# Patient Record
Sex: Male | Born: 1950
Health system: Southern US, Community
[De-identification: ages and names within clinical notes are randomized; demographics above are authoritative.]

## PROBLEM LIST (undated history)

## (undated) DIAGNOSIS — N400 Enlarged prostate without lower urinary tract symptoms: Secondary | ICD-10-CM

## (undated) DIAGNOSIS — IMO0002 Reserved for concepts with insufficient information to code with codable children: Secondary | ICD-10-CM

## (undated) DIAGNOSIS — L408 Other psoriasis: Secondary | ICD-10-CM

## (undated) HISTORY — DX: Benign prostatic hyperplasia without lower urinary tract symptoms: N40.0

## (undated) HISTORY — DX: Reserved for concepts with insufficient information to code with codable children: IMO0002

## (undated) HISTORY — DX: Other psoriasis: L40.8

## (undated) HISTORY — PX: BACK SURGERY: SHX140

---

## 2004-03-27 ENCOUNTER — Ambulatory Visit: Payer: Self-pay | Admitting: Family Medicine

## 2004-04-03 ENCOUNTER — Ambulatory Visit: Payer: Self-pay | Admitting: Family Medicine

## 2004-04-03 ENCOUNTER — Encounter: Admission: RE | Admit: 2004-04-03 | Discharge: 2004-04-03 | Payer: Self-pay | Admitting: Family Medicine

## 2004-04-19 ENCOUNTER — Ambulatory Visit: Payer: Self-pay | Admitting: Gastroenterology

## 2004-05-03 ENCOUNTER — Ambulatory Visit: Payer: Self-pay | Admitting: Gastroenterology

## 2004-05-24 ENCOUNTER — Ambulatory Visit: Payer: Self-pay | Admitting: Family Medicine

## 2004-05-30 ENCOUNTER — Ambulatory Visit: Payer: Self-pay | Admitting: Family Medicine

## 2004-06-07 ENCOUNTER — Encounter (HOSPITAL_COMMUNITY): Admission: RE | Admit: 2004-06-07 | Discharge: 2004-06-07 | Payer: Self-pay | Admitting: Family Medicine

## 2004-07-07 ENCOUNTER — Ambulatory Visit: Payer: Self-pay | Admitting: Family Medicine

## 2005-03-28 ENCOUNTER — Ambulatory Visit: Payer: Self-pay | Admitting: Family Medicine

## 2005-04-16 ENCOUNTER — Ambulatory Visit: Payer: Self-pay | Admitting: Family Medicine

## 2005-05-11 ENCOUNTER — Encounter: Admission: RE | Admit: 2005-05-11 | Discharge: 2005-05-11 | Payer: Self-pay | Admitting: Family Medicine

## 2005-05-22 ENCOUNTER — Ambulatory Visit: Payer: Self-pay | Admitting: Cardiology

## 2005-05-24 ENCOUNTER — Ambulatory Visit: Payer: Self-pay | Admitting: Family Medicine

## 2005-06-12 ENCOUNTER — Ambulatory Visit: Payer: Self-pay | Admitting: Pulmonary Disease

## 2006-04-19 ENCOUNTER — Ambulatory Visit: Payer: Self-pay | Admitting: Family Medicine

## 2006-04-19 LAB — CONVERTED CEMR LAB
AST: 37 units/L (ref 0–37)
Albumin: 4.2 g/dL (ref 3.5–5.2)
Alkaline Phosphatase: 75 units/L (ref 39–117)
BUN: 15 mg/dL (ref 6–23)
Basophils Absolute: 0 10*3/uL (ref 0.0–0.1)
Calcium: 9.7 mg/dL (ref 8.4–10.5)
Chloride: 104 meq/L (ref 96–112)
Cholesterol: 126 mg/dL (ref 0–200)
Creatinine, Ser: 1 mg/dL (ref 0.4–1.5)
Eosinophils Absolute: 0.4 10*3/uL (ref 0.0–0.6)
GFR calc non Af Amer: 82 mL/min
LDL Cholesterol: 74 mg/dL (ref 0–99)
MCHC: 35.5 g/dL (ref 30.0–36.0)
MCV: 95.6 fL (ref 78.0–100.0)
Monocytes Relative: 9.8 % (ref 3.0–11.0)
PSA: 1.08 ng/mL (ref 0.10–4.00)
Platelets: 320 10*3/uL (ref 150–400)
RBC: 4.25 M/uL (ref 4.22–5.81)
RDW: 12.2 % (ref 11.5–14.6)
Sodium: 144 meq/L (ref 135–145)
Total CHOL/HDL Ratio: 2.8
Triglycerides: 37 mg/dL (ref 0–149)

## 2006-09-18 DIAGNOSIS — N400 Enlarged prostate without lower urinary tract symptoms: Secondary | ICD-10-CM | POA: Insufficient documentation

## 2006-09-18 HISTORY — DX: Benign prostatic hyperplasia without lower urinary tract symptoms: N40.0

## 2007-07-04 ENCOUNTER — Ambulatory Visit: Payer: Self-pay | Admitting: Family Medicine

## 2007-07-04 LAB — CONVERTED CEMR LAB
Alkaline Phosphatase: 48 units/L (ref 39–117)
Bilirubin, Direct: 0.1 mg/dL (ref 0.0–0.3)
Eosinophils Absolute: 0.3 10*3/uL (ref 0.0–0.7)
GFR calc Af Amer: 99 mL/min
GFR calc non Af Amer: 82 mL/min
HCT: 41.1 % (ref 39.0–52.0)
HDL: 45.3 mg/dL (ref >39.0)
MCV: 97.7 fL (ref 78.0–100.0)
Monocytes Absolute: 0.4 10*3/uL (ref 0.1–1.0)
Monocytes Relative: 7.9 % (ref 3.0–12.0)
Neutrophils Relative %: 60.3 % (ref 43.0–77.0)
Platelets: 335 10*3/uL (ref 150–400)
Potassium: 4.4 meq/L (ref 3.5–5.1)
RDW: 12.7 % (ref 11.5–14.6)
Sodium: 139 meq/L (ref 135–145)
Total CHOL/HDL Ratio: 2.8
Triglycerides: 32 mg/dL (ref 0–149)
VLDL: 6 mg/dL (ref 0–40)

## 2007-07-14 ENCOUNTER — Ambulatory Visit: Payer: Self-pay | Admitting: Family Medicine

## 2007-07-14 DIAGNOSIS — L409 Psoriasis, unspecified: Secondary | ICD-10-CM

## 2007-07-14 DIAGNOSIS — L408 Other psoriasis: Secondary | ICD-10-CM

## 2007-07-14 HISTORY — DX: Other psoriasis: L40.8

## 2010-01-30 ENCOUNTER — Other Ambulatory Visit: Payer: Self-pay | Admitting: Family Medicine

## 2010-01-30 ENCOUNTER — Ambulatory Visit
Admission: RE | Admit: 2010-01-30 | Discharge: 2010-01-30 | Payer: Self-pay | Source: Home / Self Care | Attending: Family Medicine | Admitting: Family Medicine

## 2010-01-30 LAB — CONVERTED CEMR LAB
Bilirubin Urine: NEGATIVE
Blood in Urine, dipstick: NEGATIVE
Glucose, Urine, Semiquant: NEGATIVE
Ketones, urine, test strip: NEGATIVE
Protein, U semiquant: NEGATIVE
pH: 6

## 2010-01-30 LAB — CBC WITH DIFFERENTIAL/PLATELET
Basophils Relative: 0.3 % (ref 0.0–3.0)
Eosinophils Absolute: 0.7 10*3/uL (ref 0.0–0.7)
Eosinophils Relative: 9.3 % — ABNORMAL HIGH (ref 0.0–5.0)
Lymphocytes Relative: 21 % (ref 12.0–46.0)
MCHC: 33.8 g/dL (ref 30.0–36.0)
MCV: 98.1 fl (ref 78.0–100.0)
Monocytes Absolute: 0.6 10*3/uL (ref 0.1–1.0)
Neutrophils Relative %: 61.6 % (ref 43.0–77.0)
Platelets: 313 10*3/uL (ref 150.0–400.0)
RBC: 4.51 Mil/uL (ref 4.22–5.81)
WBC: 7.4 10*3/uL (ref 4.5–10.5)

## 2010-01-30 LAB — HEPATIC FUNCTION PANEL
Alkaline Phosphatase: 85 U/L (ref 39–117)
Bilirubin, Direct: 0.1 mg/dL (ref 0.0–0.3)
Total Bilirubin: 0.7 mg/dL (ref 0.3–1.2)
Total Protein: 6.9 g/dL (ref 6.0–8.3)

## 2010-01-30 LAB — LIPID PANEL
Cholesterol: 141 mg/dL (ref 0–200)
HDL: 52.9 mg/dL (ref >39.00)
LDL Cholesterol: 80 mg/dL (ref 0–99)
Total CHOL/HDL Ratio: 3
Triglycerides: 41 mg/dL (ref 0.0–149.0)

## 2010-01-30 LAB — BASIC METABOLIC PANEL
BUN: 16 mg/dL (ref 6–23)
Calcium: 9 mg/dL (ref 8.4–10.5)
Chloride: 101 mEq/L (ref 96–112)
Creatinine, Ser: 0.9 mg/dL (ref 0.4–1.5)

## 2010-01-30 LAB — PSA: PSA: 0.88 ng/mL (ref 0.10–4.00)

## 2010-02-06 ENCOUNTER — Encounter: Payer: Self-pay | Admitting: Family Medicine

## 2010-02-06 ENCOUNTER — Ambulatory Visit
Admission: RE | Admit: 2010-02-06 | Discharge: 2010-02-06 | Payer: Self-pay | Source: Home / Self Care | Attending: Family Medicine | Admitting: Family Medicine

## 2010-02-06 DIAGNOSIS — M799 Soft tissue disorder, unspecified: Secondary | ICD-10-CM | POA: Insufficient documentation

## 2010-02-06 DIAGNOSIS — F528 Other sexual dysfunction not due to a substance or known physiological condition: Secondary | ICD-10-CM | POA: Insufficient documentation

## 2010-02-07 ENCOUNTER — Ambulatory Visit
Admission: RE | Admit: 2010-02-07 | Discharge: 2010-02-07 | Payer: Self-pay | Source: Home / Self Care | Attending: Family Medicine | Admitting: Family Medicine

## 2010-02-08 ENCOUNTER — Encounter: Payer: Self-pay | Admitting: Family Medicine

## 2010-02-08 ENCOUNTER — Other Ambulatory Visit: Payer: Self-pay | Admitting: Family Medicine

## 2010-02-08 DIAGNOSIS — IMO0002 Reserved for concepts with insufficient information to code with codable children: Secondary | ICD-10-CM

## 2010-02-14 ENCOUNTER — Ambulatory Visit: Payer: Self-pay | Admitting: Physical Therapy

## 2010-02-15 NOTE — Assessment & Plan Note (Signed)
Summary: cpx/njr   Vital Signs:  Patient profile:   60 year old male Height:      70.25 inches Weight:      183 pounds BMI:     26.17 Temp:     97.7 degrees F oral BP sitting:   110 / 80  (left arm) Cuff size:   regular  Vitals Entered By: Kern Reap CMA Guerrini Dull) (February 06, 2010 3:57 PM) CC: wellness exam   CC:  wellness exam.  History of Present Illness: Todd Carpenter is a 60 year old, married male, nonsmokerwho comes in today for general medical exam, because of underlying psoriasis, and pain and numbness in his left arm x 3 months and a request for Viagra  His psoriasis is acting up sneezer really inflamed.  It.  He's been using the Dovonex and Lidex.  We discussed taking a short course of prednisone.  Also he's had pain in the left side of his neck with some radiation down to his left arm for about 3 months.  No history of trauma.  He also has some numbness and tingling that comes and goes.  He's experiencing some mild ED, and would like some Viagra.  Tetanus 2009  Allergies: No Known Drug Allergies  Past History:  Past medical, surgical, family and social histories (including risk factors) reviewed, and no changes noted (except as noted below).  Past Medical History: Reviewed history from 09/18/2006 and no changes required. Psoriasis Benign prostatic hypertrophy  Past Surgical History: Reviewed history from 09/18/2006 and no changes required. Disc Back Surgery  Family History: Reviewed history from 09/18/2006 and no changes required. Family History of Cervical cancer Family History of Prostate CA 1st degree relative <50 Family History of Ulcers Family History of Cardiovascular disorder  Social History: Reviewed history from 09/18/2006 and no changes required. Married Former Smoker Alcohol use-no Drug use-yes  Review of Systems      See HPI  Physical Exam  General:  Well-developed,well-nourished,in no acute distress; alert,appropriate and cooperative  throughout examination Head:  Normocephalic and atraumatic without obvious abnormalities. No apparent alopecia or balding. Eyes:  No corneal or conjunctival inflammation noted. EOMI. Perrla. Funduscopic exam benign, without hemorrhages, exudates or papilledema. Vision grossly normal. Ears:  External ear exam shows no significant lesions or deformities.  Otoscopic examination reveals clear canals, tympanic membranes are intact bilaterally without bulging, retraction, inflammation or discharge. Hearing is grossly normal bilaterally. Nose:  External nasal examination shows no deformity or inflammation. Nasal mucosa are pink and moist without lesions or exudates. Mouth:  Oral mucosa and oropharynx without lesions or exudates.  Teeth in good repair. Neck:  No deformities, masses, or tenderness noted. Chest Wall:  No deformities, masses, tenderness or gynecomastia noted. Breasts:  No masses or gynecomastia noted Lungs:  Normal respiratory effort, chest expands symmetrically. Lungs are clear to auscultation, no crackles or wheezes. Heart:  Normal rate and regular rhythm. S1 and S2 normal without gallop, murmur, click, rub or other extra sounds. Abdomen:  Bowel sounds positive,abdomen soft and non-tender without masses, organomegaly or hernias noted. Rectal:  No external abnormalities noted. Normal sphincter tone. No rectal masses or tenderness. Genitalia:  Testes bilaterally descended without nodularity, tenderness or masses. No scrotal masses or lesions. No penis lesions or urethral discharge. Prostate:  Prostate gland firm and smooth, no enlargement, nodularity, tenderness, mass, asymmetry or induration. Msk:  No deformity or scoliosis noted of thoracic or lumbar spine.   Pulses:  R and L carotid,radial,femoral,dorsalis pedis and posterior tibial pulses are full and  equal bilaterally Extremities:  No clubbing, cyanosis, edema, or deformity noted with normal full range of motion of all joints.     Neurologic:  No cranial nerve deficits noted. Station and gait are normal. Plantar reflexes are down-going bilaterally. DTRs are symmetrical throughout. Sensory, motor and coordinative functions appear intact. Skin:  total body skin exam except for severe psoriasis of both knees Cervical Nodes:  No lymphadenopathy noted Axillary Nodes:  No palpable lymphadenopathy Inguinal Nodes:  No significant adenopathy Psych:  Cognition and judgment appear intact. Alert and cooperative with normal attention span and concentration. No apparent delusions, illusions, hallucinations   Problems:  Medical Problems Added: 1)  Dx of Cervical Disorder, Nos  (ICD-723.9) 2)  Dx of Erectile Dysfunction  (ICD-302.72)  Impression & Recommendations:  Problem # 1:  CERVICAL DISORDER, NOS (ICD-723.9) Assessment New  Orders: Physical Therapy Referral (PT) T-Cervical Spine Comp w/Flex & Ext (16109UE) Prescription Created Electronically [phone])  Problem # 2:  ERECTILE DYSFUNCTION (ICD-302.72) Assessment: New  His updated medication list for this problem includes:    Viagra 50 Mg Tabs (Sildenafil citrate) ..... Uad  Orders: Prescription Created Electronically [phone])  Problem # 3:  ROUTINE GENERAL MEDICAL EXAM@HEALTH  CARE FACL (ICD-V70.0) Assessment: Unchanged  Orders: Prescription Created Electronically [phone]) EKG w/ Interpretation (93000)  Problem # 4:  PSORIASIS (ICD-696.1) Assessment: Deteriorated  Orders: Prescription Created Electronically [phone])  Complete Medication List: 1)  Dovonex 0.005 % Crea (Calcipotriene) .... Apply two times a day 2)  Lidex 0.05 % Crea (Fluocinonide) .... Apply two times a day 3)  Adult Aspirin Low Strength 81 Mg Tbdp (Aspirin) 4)  Prednisone 20 Mg Tabs (Prednisone) .... Uad 5)  Viagra 50 Mg Tabs (Sildenafil citrate) .... Uad  Patient Instructions: 1)  begin prednisone two tabs x 3 days, one x 3 days, a half x 3 days, then half a tablet Monday, Wednesday,  Friday, for a 3-week taper. 2)  This should help not only her psoriasis, but the cervical disk problem. 3)  I will put in a request for physical therapy. 4)  Return in 3 weeks for follow-up Prescriptions: VIAGRA 50 MG TABS (SILDENAFIL CITRATE) UAD  #6 x 11   Entered and Authorized by:   Roderick Pee MD   Signed by:   Roderick Pee MD on 02/06/2010   Method used:   Electronically to        [facility] [address] (retail)       [phone]       [place], [place]  [address]       Ph: [phone]       Fax: [phone]   RxID:   [id number] LIDEX 0.05 %  CREA (FLUOCINONIDE) apply two times a day  #60gm x 6   Entered and Authorized by:   Roderick Pee MD   Signed by:   Roderick Pee MD on 02/06/2010   Method used:   Electronically to        [facility] [phone]* (retail)       [address]       [place], [place]  [address]       Ph: [phone]       Fax: [phone]   RxID:   [id number] DOVONEX 0.005 %  CREA (CALCIPOTRIENE) apply two times a day  #60 gr x 6   Entered and Authorized by:  Roderick Pee MD   Signed by:   Roderick Pee MD on 02/06/2010   Method used:   Electronically to        [facility] [phone]* (retail)       [address]       [place], [place]  [address]       Ph: [phone]       Fax: [phone]   RxID:   [id number] PREDNISONE 20 MG TABS (PREDNISONE) UAD  #50 x 1   Entered and Authorized by:   Roderick Pee MD   Signed by:   Roderick Pee MD on 02/06/2010   Method used:   Electronically to        [facility] [phone]* (retail)       [address]       [place], [place]  [address]       Ph: [phone]       Fax: [phone]   RxID:   [id number]    Orders Added: 1)  Physical Therapy Referral [PT] 2)  T-Cervical Spine Comp w/Flex & Ext [72052TC] 3)  Prescription Created Electronically  [G8553] 4)  Est. Patient 40-64 years [99396] 5)  EKG w/ Interpretation [93000]

## 2010-02-16 ENCOUNTER — Other Ambulatory Visit: Payer: Self-pay | Admitting: Neurosurgery

## 2010-02-16 DIAGNOSIS — M542 Cervicalgia: Secondary | ICD-10-CM

## 2010-02-16 DIAGNOSIS — M25519 Pain in unspecified shoulder: Secondary | ICD-10-CM

## 2010-02-19 ENCOUNTER — Ambulatory Visit
Admission: RE | Admit: 2010-02-19 | Discharge: 2010-02-19 | Disposition: A | Discharge status: Home / Self Care | Payer: BC Managed Care – PPO | Source: Ambulatory Visit | Attending: Neurosurgery | Admitting: Neurosurgery

## 2010-02-19 DIAGNOSIS — M25519 Pain in unspecified shoulder: Secondary | ICD-10-CM

## 2010-02-19 DIAGNOSIS — M542 Cervicalgia: Secondary | ICD-10-CM

## 2010-02-27 ENCOUNTER — Ambulatory Visit (INDEPENDENT_AMBULATORY_CARE_PROVIDER_SITE_OTHER): Payer: BC Managed Care – PPO | Admitting: Family Medicine

## 2010-02-27 ENCOUNTER — Encounter: Payer: Self-pay | Admitting: Family Medicine

## 2010-02-27 VITALS — BP 120/80 | Temp 98.2°F | Ht 70.25 in | Wt 188.0 lb

## 2010-02-27 DIAGNOSIS — M5382 Other specified dorsopathies, cervical region: Secondary | ICD-10-CM

## 2010-02-27 MED ORDER — HYDROCODONE-ACETAMINOPHEN 7.5-750 MG PO TABS: 1.0000 | ORAL_TABLET | Freq: Four times a day (QID) | ORAL | Status: AC | PRN

## 2010-02-27 NOTE — Patient Instructions (Signed)
Follow up with neurosurgery

## 2010-02-27 NOTE — Progress Notes (Signed)
  Subjective:    Patient ID: Todd Carpenter, male    DOB: 12-13-1950, 60 y.o.   MRN: [id number]  HPITimothy is a 60 year old male, who comes back today for follow-up of neck pain.  We saw him a couple weeks ago with severe neck pain.  At that time.  His neurologic examination was normal however, because of the severe pain.  We got some x-rays, which showed some severe degenerative changes at C4, C3 through C6-7.  We sent him to see the neurosurgeon.  He saw Dr. Wynetta Emery  who recommended an MRI.  He is due to go back and see the neurosurgeon.  This Thursday for follow-up.  He now says his neck pain is getting, worse it's an 8 on a scale of one to 10 and he can't sleep at night.  He states he did not ask nor did the neurosurgeon, Nor did he  Offer any pain medication.  I offered some pain medication until he can see the neurosurgeon    Review of Systems    Negative Objective:   Physical Exam Well-developed well-nourished, male in no acute distress.  Neurologic examination still normal       Assessment & Plan:  Cervical disk disease multilevel,,,,,,,,, see the neurosurgeon on Thursday.  Follow-up with their recommendations

## 2010-03-16 HISTORY — PX: NECK SURGERY: SHX720

## 2010-04-27 ENCOUNTER — Other Ambulatory Visit: Payer: Self-pay | Admitting: Neurosurgery

## 2010-04-27 ENCOUNTER — Ambulatory Visit
Admission: RE | Admit: 2010-04-27 | Discharge: 2010-04-27 | Disposition: A | Discharge status: Home / Self Care | Payer: BC Managed Care – PPO | Source: Ambulatory Visit | Attending: Neurosurgery | Admitting: Neurosurgery

## 2010-04-27 DIAGNOSIS — M542 Cervicalgia: Secondary | ICD-10-CM

## 2010-06-08 ENCOUNTER — Other Ambulatory Visit: Payer: Self-pay | Admitting: Neurosurgery

## 2010-06-08 ENCOUNTER — Ambulatory Visit
Admission: RE | Admit: 2010-06-08 | Discharge: 2010-06-08 | Disposition: A | Discharge status: Home / Self Care | Payer: BC Managed Care – PPO | Source: Ambulatory Visit | Attending: Neurosurgery | Admitting: Neurosurgery

## 2010-06-08 DIAGNOSIS — M545 Low back pain: Secondary | ICD-10-CM

## 2010-06-08 DIAGNOSIS — M542 Cervicalgia: Secondary | ICD-10-CM

## 2010-06-11 ENCOUNTER — Ambulatory Visit
Admission: RE | Admit: 2010-06-11 | Discharge: 2010-06-11 | Disposition: A | Discharge status: Home / Self Care | Payer: BC Managed Care – PPO | Source: Ambulatory Visit | Attending: Neurosurgery | Admitting: Neurosurgery

## 2010-06-11 DIAGNOSIS — M545 Low back pain: Secondary | ICD-10-CM

## 2010-10-26 ENCOUNTER — Encounter (HOSPITAL_COMMUNITY)
Admission: RE | Admit: 2010-10-26 | Discharge: 2010-10-26 | Disposition: A | Discharge status: Home / Self Care | Payer: BC Managed Care – PPO | Source: Ambulatory Visit | Attending: Neurosurgery | Admitting: Neurosurgery

## 2010-10-26 DIAGNOSIS — Z01812 Encounter for preprocedural laboratory examination: Secondary | ICD-10-CM | POA: Insufficient documentation

## 2010-10-26 DIAGNOSIS — Z01818 Encounter for other preprocedural examination: Secondary | ICD-10-CM | POA: Insufficient documentation

## 2010-10-26 LAB — TYPE AND SCREEN: Antibody Screen: NEGATIVE

## 2010-10-26 LAB — DIFFERENTIAL
Basophils Relative: 1 % (ref 0–1)
Eosinophils Absolute: 0.3 10*3/uL (ref 0.0–0.7)
Monocytes Relative: 10 % (ref 3–12)
Neutrophils Relative %: 57 % (ref 43–77)

## 2010-10-26 LAB — PROTIME-INR: INR: 0.98 (ref 0.00–1.49)

## 2010-10-26 LAB — CBC
HCT: 43.2 % (ref 39.0–52.0)
Hemoglobin: 14.7 g/dL (ref 13.0–17.0)
RDW: 12.4 % (ref 11.5–15.5)
WBC: 5.5 10*3/uL (ref 4.0–10.5)

## 2010-10-26 LAB — SURGICAL PCR SCREEN: Staphylococcus aureus: NEGATIVE

## 2010-11-01 ENCOUNTER — Inpatient Hospital Stay (HOSPITAL_COMMUNITY): Admission: RE | Admit: 2010-11-01 | Payer: BC Managed Care – PPO | Source: Ambulatory Visit | Admitting: Neurosurgery

## 2010-11-22 ENCOUNTER — Encounter (HOSPITAL_COMMUNITY)
Admission: RE | Admit: 2010-11-22 | Discharge: 2010-11-22 | Payer: BC Managed Care – PPO | Source: Ambulatory Visit | Attending: Neurosurgery | Admitting: Neurosurgery

## 2010-11-22 ENCOUNTER — Encounter (HOSPITAL_COMMUNITY): Payer: Self-pay

## 2010-11-22 ENCOUNTER — Encounter (HOSPITAL_COMMUNITY): Payer: Self-pay | Admitting: Pharmacy Technician

## 2010-11-22 LAB — DIFFERENTIAL
Eosinophils Absolute: 0.4 10*3/uL (ref 0.0–0.7)
Eosinophils Relative: 4 % (ref 0–5)
Lymphocytes Relative: 20 % (ref 12–46)
Lymphs Abs: 1.8 10*3/uL (ref 0.7–4.0)
Monocytes Relative: 10 % (ref 3–12)

## 2010-11-22 LAB — PROTIME-INR
INR: 0.95 (ref 0.00–1.49)
Prothrombin Time: 12.9 seconds (ref 11.6–15.2)

## 2010-11-22 LAB — CBC
HCT: 42.4 % (ref 39.0–52.0)
Hemoglobin: 14.6 g/dL (ref 13.0–17.0)
MCH: 32.5 pg (ref 26.0–34.0)
MCV: 94.4 fL (ref 78.0–100.0)
Platelets: 292 10*3/uL (ref 150–400)
RBC: 4.49 MIL/uL (ref 4.22–5.81)
WBC: 8.9 10*3/uL (ref 4.0–10.5)

## 2010-11-22 LAB — TYPE AND SCREEN
ABO/RH(D): A POS
Antibody Screen: NEGATIVE

## 2010-11-22 NOTE — Pre-Procedure Instructions (Signed)
20 Todd Carpenter  11/22/2010   Your procedure is scheduled on: Nov 29, 2010  Report to [facility] at 9:15 AM.  Call this number if you have problems the morning of surgery: [phone]   Remember:   Do not eat food:After Midnight.  Do not drink clear liquids: 4 Hours before arrival.  Take these medicines the morning of surgery with A SIP OF WATER: pain pill   Do not wear jewelry, make-up or nail polish.  Do not wear lotions, powders, or perfumes. You may wear deodorant.  Do not shave 48 hours prior to surgery.  Do not bring valuables to the hospital.  Contacts, dentures or bridgework may not be worn into surgery.  Leave suitcase in the car. After surgery it may be brought to your room.  For patients admitted to the hospital, checkout time is 11:00 AM the day of discharge.   Patients discharged the day of surgery will not be allowed to drive home.  Name and phone number of your driver: NA  Special Instructions: CHG Shower Use Special Wash: 1/2 bottle night before surgery and 1/2 bottle morning of surgery.   Please read over the following fact sheets that you were given: Pain Booklet, Blood Transfusion Information and Surgical Site Infection Prevention

## 2010-11-28 MED ORDER — CEFAZOLIN SODIUM 1-5 GM-% IV SOLN
1.0000 g | INTRAVENOUS | Status: AC
  Administered 2010-11-29: 1 g via INTRAVENOUS
  Filled 2010-11-28: qty 50

## 2010-11-29 ENCOUNTER — Inpatient Hospital Stay (HOSPITAL_COMMUNITY)
Admission: RE | Admit: 2010-11-29 | Discharge: 2010-12-05 | DRG: 755 | Disposition: A | Discharge status: Home / Self Care | Payer: BC Managed Care – PPO | Source: Ambulatory Visit | Attending: Neurosurgery | Admitting: Neurosurgery

## 2010-11-29 ENCOUNTER — Encounter (HOSPITAL_COMMUNITY): Payer: Self-pay | Admitting: Certified Registered"

## 2010-11-29 ENCOUNTER — Encounter (HOSPITAL_COMMUNITY)
Admission: RE | Disposition: A | Discharge status: Home / Self Care | Payer: Self-pay | Source: Ambulatory Visit | Attending: Neurosurgery

## 2010-11-29 ENCOUNTER — Inpatient Hospital Stay (HOSPITAL_COMMUNITY): Payer: BC Managed Care – PPO | Admitting: Certified Registered"

## 2010-11-29 ENCOUNTER — Inpatient Hospital Stay (HOSPITAL_COMMUNITY): Payer: BC Managed Care – PPO

## 2010-11-29 DIAGNOSIS — M5137 Other intervertebral disc degeneration, lumbosacral region: Principal | ICD-10-CM | POA: Diagnosis present

## 2010-11-29 DIAGNOSIS — K560 Paralytic ileus: Secondary | ICD-10-CM | POA: Diagnosis not present

## 2010-11-29 DIAGNOSIS — Z01812 Encounter for preprocedural laboratory examination: Secondary | ICD-10-CM

## 2010-11-29 DIAGNOSIS — M5382 Other specified dorsopathies, cervical region: Secondary | ICD-10-CM | POA: Diagnosis present

## 2010-11-29 DIAGNOSIS — M51379 Other intervertebral disc degeneration, lumbosacral region without mention of lumbar back pain or lower extremity pain: Principal | ICD-10-CM | POA: Diagnosis present

## 2010-11-29 DIAGNOSIS — N529 Male erectile dysfunction, unspecified: Secondary | ICD-10-CM | POA: Diagnosis present

## 2010-11-29 DIAGNOSIS — N400 Enlarged prostate without lower urinary tract symptoms: Secondary | ICD-10-CM | POA: Diagnosis present

## 2010-11-29 DIAGNOSIS — K929 Disease of digestive system, unspecified: Secondary | ICD-10-CM | POA: Diagnosis not present

## 2010-11-29 DIAGNOSIS — L408 Other psoriasis: Secondary | ICD-10-CM | POA: Diagnosis present

## 2010-11-29 SURGERY — POSTERIOR LUMBAR FUSION 1 LEVEL
Anesthesia: General | Site: Back | Wound class: Clean

## 2010-11-29 MED ORDER — HETASTARCH-ELECTROLYTES 6 % IV SOLN
INTRAVENOUS | Status: DC | PRN
  Administered 2010-11-29: 10:00:00 via INTRAVENOUS

## 2010-11-29 MED ORDER — ONDANSETRON HCL 4 MG/2ML IJ SOLN
INTRAMUSCULAR | Status: DC | PRN
  Administered 2010-11-29: 4 mg via INTRAVENOUS

## 2010-11-29 MED ORDER — CEFAZOLIN SODIUM 1-5 GM-% IV SOLN
1.0000 g | Freq: Three times a day (TID) | INTRAVENOUS | Status: AC
  Administered 2010-11-29 (×2): 1 g via INTRAVENOUS
  Filled 2010-11-29 (×2): qty 50

## 2010-11-29 MED ORDER — SODIUM CHLORIDE 0.9 % IR SOLN
Status: DC | PRN
  Administered 2010-11-29: 09:00:00

## 2010-11-29 MED ORDER — ACETAMINOPHEN 325 MG PO TABS: 650.0000 mg | ORAL_TABLET | ORAL | Status: DC | PRN

## 2010-11-29 MED ORDER — DIPHENHYDRAMINE HCL 12.5 MG/5ML PO ELIX: 12.5000 mg | ORAL_SOLUTION | Freq: Four times a day (QID) | ORAL | Status: DC | PRN

## 2010-11-29 MED ORDER — LACTATED RINGERS IV SOLN
INTRAVENOUS | Status: DC | PRN
  Administered 2010-11-29 (×2): via INTRAVENOUS

## 2010-11-29 MED ORDER — SODIUM CHLORIDE 0.9 % IJ SOLN: 9.0000 mL | INTRAMUSCULAR | Status: DC | PRN

## 2010-11-29 MED ORDER — SODIUM CHLORIDE 0.9 % IJ SOLN: 3.0000 mL | INTRAMUSCULAR | Status: DC | PRN

## 2010-11-29 MED ORDER — ONDANSETRON HCL 4 MG/2ML IJ SOLN
4.0000 mg | INTRAMUSCULAR | Status: DC | PRN
  Administered 2010-11-29 – 2010-11-30 (×4): 4 mg via INTRAVENOUS
  Filled 2010-11-29 (×4): qty 2

## 2010-11-29 MED ORDER — OXYCODONE-ACETAMINOPHEN 5-325 MG PO TABS
1.0000 | ORAL_TABLET | ORAL | Status: DC | PRN
  Administered 2010-12-01 – 2010-12-02 (×8): 2 via ORAL
  Administered 2010-12-02: 1 via ORAL
  Administered 2010-12-03 (×2): 2 via ORAL
  Administered 2010-12-03: 1 via ORAL
  Administered 2010-12-03 – 2010-12-04 (×3): 2 via ORAL
  Administered 2010-12-04: 1 via ORAL
  Administered 2010-12-04 – 2010-12-05 (×4): 2 via ORAL
  Administered 2010-12-05: 1 via ORAL
  Filled 2010-11-29 (×2): qty 2
  Filled 2010-11-29: qty 1
  Filled 2010-11-29 (×4): qty 2
  Filled 2010-11-29: qty 1
  Filled 2010-11-29 (×2): qty 2
  Filled 2010-11-29: qty 1
  Filled 2010-11-29 (×4): qty 2
  Filled 2010-11-29: qty 1
  Filled 2010-11-29 (×6): qty 2

## 2010-11-29 MED ORDER — ONDANSETRON HCL 4 MG/2ML IJ SOLN: 4.0000 mg | Freq: Four times a day (QID) | INTRAMUSCULAR | Status: DC | PRN

## 2010-11-29 MED ORDER — ACETAMINOPHEN 650 MG RE SUPP: 650.0000 mg | RECTAL | Status: DC | PRN

## 2010-11-29 MED ORDER — PROMETHAZINE HCL 25 MG/ML IJ SOLN: 6.2500 mg | INTRAMUSCULAR | Status: DC | PRN

## 2010-11-29 MED ORDER — SODIUM CHLORIDE 0.9 % IR SOLN
Status: DC | PRN
  Administered 2010-11-29: 1000 mL

## 2010-11-29 MED ORDER — ZOLPIDEM TARTRATE 10 MG PO TABS
10.0000 mg | ORAL_TABLET | Freq: Once | ORAL | Status: AC
  Administered 2010-11-29: 10 mg via ORAL
  Filled 2010-11-29: qty 1

## 2010-11-29 MED ORDER — GLYCOPYRROLATE 0.2 MG/ML IJ SOLN
INTRAMUSCULAR | Status: DC | PRN
  Administered 2010-11-29: .4 mg via INTRAVENOUS

## 2010-11-29 MED ORDER — LIDOCAINE-EPINEPHRINE 1 %-1:100000 IJ SOLN
INTRAMUSCULAR | Status: DC | PRN
  Administered 2010-11-29: 30 mL

## 2010-11-29 MED ORDER — CALCIPOTRIENE 0.005 % EX CREA: TOPICAL_CREAM | Freq: Two times a day (BID) | CUTANEOUS | Status: DC

## 2010-11-29 MED ORDER — DIPHENHYDRAMINE HCL 12.5 MG/5ML PO ELIX
12.5000 mg | ORAL_SOLUTION | Freq: Four times a day (QID) | ORAL | Status: DC | PRN
  Filled 2010-11-29: qty 5

## 2010-11-29 MED ORDER — CYCLOBENZAPRINE HCL 10 MG PO TABS
10.0000 mg | ORAL_TABLET | Freq: Three times a day (TID) | ORAL | Status: DC | PRN
  Administered 2010-12-02 – 2010-12-05 (×7): 10 mg via ORAL
  Filled 2010-11-29 (×7): qty 1

## 2010-11-29 MED ORDER — MIDAZOLAM HCL 5 MG/5ML IJ SOLN
INTRAMUSCULAR | Status: DC | PRN
  Administered 2010-11-29: 2 mg via INTRAVENOUS

## 2010-11-29 MED ORDER — HYDROCODONE-ACETAMINOPHEN 5-325 MG PO TABS
1.0000 | ORAL_TABLET | ORAL | Status: DC | PRN
  Administered 2010-11-30 – 2010-12-05 (×8): 1 via ORAL
  Filled 2010-11-29 (×8): qty 1

## 2010-11-29 MED ORDER — SODIUM CHLORIDE 0.9 % IJ SOLN
3.0000 mL | Freq: Two times a day (BID) | INTRAMUSCULAR | Status: DC
  Administered 2010-11-29 – 2010-12-04 (×11): 3 mL via INTRAVENOUS

## 2010-11-29 MED ORDER — ONDANSETRON HCL 4 MG/2ML IJ SOLN
4.0000 mg | Freq: Four times a day (QID) | INTRAMUSCULAR | Status: DC | PRN
  Filled 2010-11-29: qty 2

## 2010-11-29 MED ORDER — PHENYLEPHRINE HCL 10 MG/ML IJ SOLN
INTRAMUSCULAR | Status: DC | PRN
  Administered 2010-11-29 (×2): 80 ug via INTRAVENOUS

## 2010-11-29 MED ORDER — DIPHENHYDRAMINE HCL 50 MG/ML IJ SOLN
12.5000 mg | Freq: Four times a day (QID) | INTRAMUSCULAR | Status: DC | PRN
  Filled 2010-11-29: qty 0.25

## 2010-11-29 MED ORDER — SODIUM CHLORIDE 0.9 % IV SOLN
250.0000 mL | INTRAVENOUS | Status: DC
  Administered 2010-11-30: 250 mL via INTRAVENOUS

## 2010-11-29 MED ORDER — EPHEDRINE SULFATE 50 MG/ML IJ SOLN
INTRAMUSCULAR | Status: DC | PRN
  Administered 2010-11-29 (×4): 10 mg via INTRAVENOUS

## 2010-11-29 MED ORDER — MENTHOL 3 MG MT LOZG: 1.0000 | LOZENGE | OROMUCOSAL | Status: DC | PRN

## 2010-11-29 MED ORDER — POTASSIUM CHLORIDE IN NACL 20-0.45 MEQ/L-% IV SOLN
INTRAVENOUS | Status: DC
  Administered 2010-11-29: 15:00:00 via INTRAVENOUS
  Administered 2010-11-30: 75 mL/h via INTRAVENOUS
  Administered 2010-11-30 – 2010-12-03 (×6): via INTRAVENOUS
  Filled 2010-11-29 (×13): qty 1000

## 2010-11-29 MED ORDER — HYDROMORPHONE 0.3 MG/ML IV SOLN
INTRAVENOUS | Status: AC
  Administered 2010-11-29: 7.5 mg via INTRAVENOUS
  Filled 2010-11-29: qty 25

## 2010-11-29 MED ORDER — HYDROCODONE-ACETAMINOPHEN 5-500 MG PO TABS: 1.5000 | ORAL_TABLET | ORAL | Status: DC | PRN

## 2010-11-29 MED ORDER — NALOXONE HCL 0.4 MG/ML IJ SOLN: 0.4000 mg | INTRAMUSCULAR | Status: DC | PRN

## 2010-11-29 MED ORDER — NALOXONE HCL 0.4 MG/ML IJ SOLN
0.4000 mg | INTRAMUSCULAR | Status: DC | PRN
  Filled 2010-11-29: qty 1

## 2010-11-29 MED ORDER — HYDROMORPHONE 0.3 MG/ML IV SOLN
INTRAVENOUS | Status: DC
  Administered 2010-11-29: 0.6 mg via INTRAVENOUS
  Administered 2010-11-29: 4.5 mg via INTRAVENOUS
  Administered 2010-11-30: 0.3 mg via INTRAVENOUS
  Administered 2010-11-30: 1.2 mg via INTRAVENOUS

## 2010-11-29 MED ORDER — DIPHENHYDRAMINE HCL 50 MG/ML IJ SOLN: 12.5000 mg | Freq: Four times a day (QID) | INTRAMUSCULAR | Status: DC | PRN

## 2010-11-29 MED ORDER — NEOSTIGMINE METHYLSULFATE 1 MG/ML IJ SOLN
INTRAMUSCULAR | Status: DC | PRN
  Administered 2010-11-29: 3 mg via INTRAVENOUS

## 2010-11-29 MED ORDER — PHENOL 1.4 % MT LIQD: 1.0000 | OROMUCOSAL | Status: DC | PRN

## 2010-11-29 MED ORDER — DOCUSATE SODIUM 100 MG PO CAPS
100.0000 mg | ORAL_CAPSULE | Freq: Two times a day (BID) | ORAL | Status: DC
  Administered 2010-11-29 – 2010-12-05 (×12): 100 mg via ORAL
  Filled 2010-11-29 (×10): qty 1

## 2010-11-29 MED ORDER — VECURONIUM BROMIDE 10 MG IV SOLR
INTRAVENOUS | Status: DC | PRN
  Administered 2010-11-29 (×4): 2 mg via INTRAVENOUS

## 2010-11-29 MED ORDER — PROPOFOL 10 MG/ML IV EMUL
INTRAVENOUS | Status: DC | PRN
  Administered 2010-11-29: 200 mg via INTRAVENOUS

## 2010-11-29 MED ORDER — FENTANYL CITRATE 0.05 MG/ML IJ SOLN
INTRAMUSCULAR | Status: DC | PRN
  Administered 2010-11-29: 250 ug via INTRAVENOUS

## 2010-11-29 MED ORDER — THROMBIN 20000 UNITS EX KIT
PACK | CUTANEOUS | Status: DC | PRN
  Administered 2010-11-29: 09:00:00 via TOPICAL

## 2010-11-29 MED ORDER — ROCURONIUM BROMIDE 100 MG/10ML IV SOLN
INTRAVENOUS | Status: DC | PRN
  Administered 2010-11-29: 50 mg via INTRAVENOUS

## 2010-11-29 MED ORDER — HYDROMORPHONE HCL PF 1 MG/ML IJ SOLN
0.2500 mg | INTRAMUSCULAR | Status: DC | PRN
  Administered 2010-11-29: 0.5 mg via INTRAVENOUS

## 2010-11-29 MED ORDER — BUPIVACAINE HCL (PF) 0.25 % IJ SOLN
INTRAMUSCULAR | Status: DC | PRN
  Administered 2010-11-29: 30 mL

## 2010-11-29 SURGICAL SUPPLY — 73 items
5.5mm rod revere; globus ×1 IMPLANT
ADH SKN CLS APL DERMABOND .7 (GAUZE/BANDAGES/DRESSINGS) ×1
APL SKNCLS STERI-STRIP NONHPOA (GAUZE/BANDAGES/DRESSINGS) ×1
BAG DECANTER FOR FLEXI CONT (MISCELLANEOUS) ×2 IMPLANT
BENZOIN TINCTURE PRP APPL 2/3 (GAUZE/BANDAGES/DRESSINGS) ×2 IMPLANT
BLADE SURG 11 STRL SS (BLADE) ×2 IMPLANT
BLADE SURG ROTATE 9660 (MISCELLANEOUS) IMPLANT
BRUSH SCRUB EZ PLAIN DRY (MISCELLANEOUS) ×2 IMPLANT
BUR MATCHSTICK NEURO 3.0 LAGG (BURR) ×2 IMPLANT
BUR PRECISION FLUTE 6.0 (BURR) ×2 IMPLANT
CANISTER SUCTION 2500CC (MISCELLANEOUS) ×2 IMPLANT
CLOSURE STERI STRIP 1/2 X4 (GAUZE/BANDAGES/DRESSINGS) ×1 IMPLANT
CLOTH BEACON ORANGE TIMEOUT ST (SAFETY) ×2 IMPLANT
CONT SPEC 4OZ CLIKSEAL STRL BL (MISCELLANEOUS) ×4 IMPLANT
COVER BACK TABLE 24X17X13 BIG (DRAPES) IMPLANT
COVER TABLE BACK 60X90 (DRAPES) ×2 IMPLANT
DECANTER SPIKE VIAL GLASS SM (MISCELLANEOUS) ×2 IMPLANT
DERMABOND ADVANCED (GAUZE/BANDAGES/DRESSINGS) ×1
DERMABOND ADVANCED .7 DNX12 (GAUZE/BANDAGES/DRESSINGS) ×1 IMPLANT
DRAPE C-ARM 42X72 X-RAY (DRAPES) ×4 IMPLANT
DRAPE LAPAROTOMY 100X72X124 (DRAPES) ×2 IMPLANT
DRAPE POUCH INSTRU U-SHP 10X18 (DRAPES) ×2 IMPLANT
DRAPE PROXIMA HALF (DRAPES) IMPLANT
DRAPE SURG 17X23 STRL (DRAPES) ×2 IMPLANT
DRSG OPSITE 4X5.5 SM (GAUZE/BANDAGES/DRESSINGS) ×4 IMPLANT
DRSG OPSITE 6X11 MED (GAUZE/BANDAGES/DRESSINGS) ×1 IMPLANT
ELECT REM PT RETURN 9FT ADLT (ELECTROSURGICAL) ×2
ELECTRODE REM PT RTRN 9FT ADLT (ELECTROSURGICAL) ×1 IMPLANT
EVACUATOR 3/16  PVC DRAIN (DRAIN)
EVACUATOR 3/16 PVC DRAIN (DRAIN) IMPLANT
GAUZE SPONGE 4X4 16PLY XRAY LF (GAUZE/BANDAGES/DRESSINGS) ×1 IMPLANT
GLOVE BIO SURGEON STRL SZ8 (GLOVE) ×4 IMPLANT
GLOVE ECLIPSE 7.5 STRL STRAW (GLOVE) IMPLANT
GLOVE EXAM NITRILE LRG STRL (GLOVE) IMPLANT
GLOVE EXAM NITRILE MD LF STRL (GLOVE) ×2 IMPLANT
GLOVE EXAM NITRILE XL STR (GLOVE) IMPLANT
GLOVE EXAM NITRILE XS STR PU (GLOVE) IMPLANT
GLOVE INDICATOR 7.0 STRL GRN (GLOVE) ×2 IMPLANT
GLOVE INDICATOR 8.5 STRL (GLOVE) ×4 IMPLANT
GLOVE OPTIFIT SS 6.5 STRL BRWN (GLOVE) ×2 IMPLANT
GLOVE SS BIOGEL STRL SZ 6.5 (GLOVE) IMPLANT
GLOVE SUPERSENSE BIOGEL SZ 6.5 (GLOVE) ×1
GOWN BRE IMP SLV AUR LG STRL (GOWN DISPOSABLE) ×4 IMPLANT
GOWN BRE IMP SLV AUR XL STRL (GOWN DISPOSABLE) ×4 IMPLANT
GOWN STRL REIN 2XL LVL4 (GOWN DISPOSABLE) IMPLANT
KIT BASIN OR (CUSTOM PROCEDURE TRAY) ×2 IMPLANT
KIT ROOM TURNOVER OR (KITS) ×2 IMPLANT
LOCKING CAP (Cap) ×4 IMPLANT
NDL HYPO 25X1 1.5 SAFETY (NEEDLE) ×1 IMPLANT
NEEDLE HYPO 25X1 1.5 SAFETY (NEEDLE) ×2 IMPLANT
NS IRRIG 1000ML POUR BTL (IV SOLUTION) ×2 IMPLANT
PACK LAMINECTOMY NEURO (CUSTOM PROCEDURE TRAY) ×2 IMPLANT
PAD ARMBOARD 7.5X6 YLW CONV (MISCELLANEOUS) ×6 IMPLANT
PUTTY BONE DBX 5CC MIX (Putty) ×1 IMPLANT
REVERE PEDICLE SREW (Screw) ×8 IMPLANT
ROD CURVED 5.5X40MM (Rod) ×1 IMPLANT
SPONGE GAUZE 4X4 12PLY (GAUZE/BANDAGES/DRESSINGS) ×2 IMPLANT
SPONGE LAP 4X18 X RAY DECT (DISPOSABLE) IMPLANT
SPONGE SURGIFOAM ABS GEL 100 (HEMOSTASIS) ×2 IMPLANT
STRIP CLOSURE SKIN 1/2X4 (GAUZE/BANDAGES/DRESSINGS) ×4 IMPLANT
SUT VIC AB 0 CT1 18XCR BRD8 (SUTURE) ×2 IMPLANT
SUT VIC AB 0 CT1 8-18 (SUTURE) ×4
SUT VIC AB 2-0 CT1 18 (SUTURE) ×2 IMPLANT
SUT VICRYL 4-0 PS2 18IN ABS (SUTURE) ×2 IMPLANT
SYR 20ML ECCENTRIC (SYRINGE) ×2 IMPLANT
TAPE CLOTH SURG 4X10 WHT LF (GAUZE/BANDAGES/DRESSINGS) ×2 IMPLANT
TELAMON 8X22 (Cage) ×2 IMPLANT
TOWEL OR 17X24 6PK STRL BLUE (TOWEL DISPOSABLE) ×2 IMPLANT
TOWEL OR 17X26 10 PK STRL BLUE (TOWEL DISPOSABLE) ×2 IMPLANT
TRAY FOLEY CATH 14FRSI W/METER (CATHETERS) ×2 IMPLANT
WATER STERILE IRR 1000ML POUR (IV SOLUTION) ×2 IMPLANT
WEDGE TANGENT 8X26MM ×1 IMPLANT
crosslink,globus 124915 ×1 IMPLANT

## 2010-11-29 NOTE — H&P (Signed)
Todd Carpenter is an 60 y.o. male.   Chief Complaint: Back pain and Left greater than right leg pain HPI: the patient has long-standing low back and left greater than right leg pain. All forms of conservative treatment to physical therapy steroid injections anti-inflammatories and narcotic pain management. Previous laminectomy many years ago with good success worse over last several weeks and months. If any bowel bladder difficulties  Past Medical History  Diagnosis Date  . BENIGN PROSTATIC HYPERTROPHY 09/18/2006  . PSORIASIS 07/14/2007  . Degenerative disc disease     Past Surgical History  Procedure Date  . Neck surgery 03/16/2010  . Back surgery 1985    Family History  Problem Relation Age of Onset  . Cervical cancer Other   . Cancer Other     prostate cancer  . Ulcers Other   . Heart disease Other    Social History:  reports that he has quit smoking. He does not have any smokeless tobacco history on file. He reports that he drinks about 14.4 ounces of alcohol per week. He reports that he does not use illicit drugs.  Allergies: No Known Allergies  Medications Prior to Admission  Medication Dose Route Frequency Provider Last Rate Last Dose  . bacitracin 50,000 Units in sodium chloride irrigation 0.9 % 500 mL irrigation    PRN Mariam Dollar      . bupivacaine (MARCAINE) 0.25 % injection    PRN Donzetta Sprung Horris Speros   30 mL at 11/29/10 0845  . ceFAZolin (ANCEF) IVPB 1 g/50 mL premix  1 g Intravenous 60 min Pre-Op Mariam Dollar      . lidocaine-EPINEPHrine (XYLOCAINE W/EPI) 1 %-1:100000 (with pres) injection    PRN Donzetta Sprung Talecia Sherlin   30 mL at 11/29/10 0846  . sodium chloride irrigation 0.9 %    PRN Donzetta Sprung Maelle Sheaffer   1,000 mL at 11/29/10 0844  . Surgifoam 100 with Thrombin 20,000 units (20 ml) topical solution    PRN Mariam Dollar       Medications Prior to Admission  Medication Sig Dispense Refill  . calcipotriene (DOVONOX) 0.005 % cream Apply topically 2 (two) times daily.        . fluocinonide (LIDEX) 0.05  % cream Apply topically 2 (two) times daily.        Marland Kitchen HYDROcodone-acetaminophen (VICODIN ES) 7.5-750 MG per tablet Take 1 tablet by mouth every 6 (six) hours as needed for Pain.  50 tablet  1  . sildenafil (VIAGRA) 50 MG tablet Take 50 mg by mouth daily as needed. ed        No results found for this or any previous visit (from the past 48 hour(s)). No results found.  Review of Systems  Constitutional: Negative.   HENT: Negative.   Eyes: Negative.   Respiratory: Negative.   Cardiovascular: Negative.   Gastrointestinal: Negative.   Musculoskeletal: Positive for back pain.  Skin: Negative.     Blood pressure 134/80, pulse 70, temperature 97.6 F (36.4 C), temperature source Oral, resp. rate 18, SpO2 96.00%. Physical Exam  Constitutional: He is oriented to person, place, and time. He appears well-developed.  HENT:  Head: Normocephalic.  Eyes: Pupils are equal, round, and reactive to light.  Neck: Normal range of motion.  GI: Soft.  Musculoskeletal: Normal range of motion.  Neurological: He is alert and oriented to person, place, and time.  Reflex Scores:      Patellar reflexes are 1+ on the right side and 1+  on the left side.      Achilles reflexes are 0 on the right side and 0 on the left side.      Strength is 5 out of 5 he is so as, quads, gastrocs, anterior tibialis, and EHL. Same is grossly intact. Absent ankle jerks.     Assessment/Plan Patient presents for lumbar laminectomy and posterior lumbar interbody fusion I have explained the risks and benefits of surgery, the expectations of outcome, alternatives to surgery perioperative course. He stands and agrees to proceed forward.  Cythia Bachtel P 11/29/2010, 9:11 AM

## 2010-11-29 NOTE — Plan of Care (Signed)
Problem: Consults Goal: Diagnosis - Spinal Surgery Outcome: Progressing Spinal Fusion L4-5

## 2010-11-29 NOTE — Transfer of Care (Signed)
Immediate Anesthesia Transfer of Care Note  Patient: Todd Carpenter  Procedure(s) Performed:  POSTERIOR LUMBAR FUSION 1 LEVEL - LUMBAR FOUR-FIVE POSTERIOR LUMBAR INTERBODY FUSION  Patient Location: PACU  Anesthesia Type: General  Level of Consciousness: awake, alert , oriented and patient cooperative  Airway & Oxygen Therapy: Patient Spontanous Breathing and Patient connected to nasal cannula oxygen  Post-op Assessment: Report given to PACU RN, Post -op Vital signs reviewed and stable and Patient moving all extremities  Post vital signs: Reviewed and stable  Complications: No apparent anesthesia complications

## 2010-11-29 NOTE — Anesthesia Postprocedure Evaluation (Signed)
  Anesthesia Post-op Note  Patient: Todd Carpenter  Procedure(s) Performed:  POSTERIOR LUMBAR FUSION 1 LEVEL - LUMBAR FOUR-FIVE POSTERIOR LUMBAR INTERBODY FUSION  Patient Location: PACU  Anesthesia Type: General  Level of Consciousness: awake and alert   Airway and Oxygen Therapy: Patient Spontanous Breathing and Patient connected to nasal cannula oxygen  Post-op Pain: mild  Post-op Assessment: Post-op Vital signs reviewed, Patient's Cardiovascular Status Stable, Respiratory Function Stable, Patent Airway, No signs of Nausea or vomiting and Pain level controlled  Post-op Vital Signs: Reviewed and stable  Complications: No apparent anesthesia complications

## 2010-11-29 NOTE — Preoperative (Signed)
Beta Blockers   Reason not to administer Beta Blockers:Not Applicable 

## 2010-11-29 NOTE — Op Note (Signed)
Preoperative diagnosis: Lumbar degenerative disc disease and recurrent ruptured disc L4-5  Postoperative diagnosis: Same  Procedure: Redo decompressive lumbar laminectomy and excess will be needed in standard interbody fusion. Posterior lumbar interbody fusion L4-5. Using tangent allograft wedge and Telamon peek cage packed with locally harvested  Mixed with DBM. Pedicle screw fixation L4-5 using the globus Revere pedicle screw system. Posterior lateral to the cyst L4-5 using local autograft mixed with DBM. Placed in a large Hemovac drain.  Surgeon: Jillyn Hidden Chyane Greer  Assistant: Marikay Alar  History of present illness: Patient is a very pleasant 60 year old gentleman who had undergone a previous lumbar length laminectomy years ago, initially did very well however last several weeks and months of progressive worsening back and left greater than right leg pain. Patient failed all forms of conservative treatment with physical therapy anti-inflammatories narcotic pain medication and steroids. Imaging showed progressive deterioration at L4-5 with bone-on-bone collapse and degeneration as well as recurrent disc herniation at L4-5 on the left. Today's date conservative treatment imaging findings and clinical exam he is recommended decompression stabilization procedure. Risks and benefits of the operation, alternatives to surgery, expectations of outcome, and perioperative course were all explained to the patient and he understands and agreed to proceed forward.  Operative procedure: Patient was brought into the OR was induced under general anesthesia and positioned prone the Wilson frame. His old incision was opened up and extended cephalad caudally after infiltration 10 cc lidocaine with epi Bovie light cautery was then used to take down the subcutaneous tissues and subperiosteal dissections care lamina of L4 and L5 bilaterally. The TPs with exposed at L4-5 and interoperative x-ray confirmed the appropriate level.  Central decompression was begun with removal of the spinous process at L4. With 3 and 4 mm Kerrison punch central laminectomy and decompression was carried out in complete medial facetectomies were performed. Extensive amount of scar tissue overlying the L5 nerve root on the left side was teased off and dissected free of the dura and removed in piecemeal fashion. The L5 pedicle was identified the L5 nerve root was were dissected through the scar tissue off the pedicle of L5. Working back to the disc space a large recurrent disc space was made identified this was freed up of the scar tissue overlying the interspace and freeing up the L5 nerve root. After all of a sudden completed, the L4 foramen workability unroofed, and decompression was confirmed with wide foraminotomies at both levels. Attention was first taken pedicle screw placement using a high-speed drill a pilot hole was drilled L4 and the right, cannulae with the awl, probed, tapped with 5.5 tap, and a 6 x 40 screw surgeon the right L4. Fluoroscopy was used the step along the way to confirm Trajectory in position. In a similar fashion a 6 x 40 540 screw was inserted L5 and 06/13/1939 screws were inserted on the left at L4 and L5. After all 4 screws and placed a 60 interbody work initially the disc spaces cleanout patient's right side and a size 7 distractor was inserted. This L3 at the disc space opened up this was replaced with a size a distractor on the patient's left side where the scar tissue at the site of the recurrent disc herniation. In a size 9 distractor was inserted back in the patient's right side. The size a distractor deposition the endplates of of the appropriate sizing for the implant. The using a size 8 rotating cutter and chisel the endplates were prepped on the patient's left  side with a downgoing Epstein curet the central disc was cleaned off and the recurrent disc herniation was removed. Then a telamon peek cage packed with locally R.  cervix DBX was inserted on the left side. Then working on the right side, again interspaces prepared with a size 8 rotating cutter and chisel central disc was scraped with an Epstein curette, and local autograft mixed with a DBM was packed centrally against a Telamon the left side. Then the right-sided angiography which was inserted. Fluoroscopy again was used to the step along the way confirmed that the trajectory of the implant. After only about work up and down the was copiously irrigated meticulous hemostasis was maintained, aggressive decortication was carried TPs and lateral gutters, and the remainder of the locally harvested autograft mixed with DBX was impacted posterior laterally. Then done 2 rods were then inserted and nuts were torqued down at L5 the L4 screws compressed against L5. All the neuroforamina reexplore the coronary dilator confirm patency and confirm no migration of graft material. Gelfoam was laid over the dura large imaging was placed on the wounds and closed in layers with interrupted Vicryl and a running 4 4-0 subcuticular. The wounds and dressed the patient recovered in stable condition. All needle counts and sponge counts were correct.

## 2010-11-29 NOTE — Anesthesia Preprocedure Evaluation (Addendum)
Anesthesia Evaluation  Patient identified by MRN, date of birth, ID band Patient awake    Reviewed: Allergy & Precautions, H&P , NPO status , Patient's Chart, lab work & pertinent test results, reviewed documented beta blocker date and time   Airway Mallampati: II TM Distance: >3 FB Neck ROM: Full    Dental No notable dental hx. (+) Teeth Intact   Pulmonary  clear to auscultation  Pulmonary exam normal       Cardiovascular neg cardio ROS Regular Normal    Neuro/Psych Low back pain- hydrocodone this am    GI/Hepatic negative GI ROS, Neg liver ROS,   Endo/Other  Negative Endocrine ROS  Renal/GU negative Renal ROS     Musculoskeletal negative musculoskeletal ROS (+)   Abdominal Normal abdominal exam  (+)   Peds  Hematology negative hematology ROS (+)   Anesthesia Other Findings   Reproductive/Obstetrics                         Anesthesia Physical Anesthesia Plan  ASA: I  Anesthesia Plan: General   Post-op Pain Management:    Induction: Intravenous  Airway Management Planned: Oral ETT  Additional Equipment:   Intra-op Plan:   Post-operative Plan: Extubation in OR  Informed Consent: I have reviewed the patients History and Physical, chart, labs and discussed the procedure including the risks, benefits and alternatives for the proposed anesthesia with the patient or authorized representative who has indicated his/her understanding and acceptance.   Dental advisory given  Plan Discussed with: CRNA and Surgeon  Anesthesia Plan Comments: (Plan routine monitors, GETA with careful prone positioning)        Anesthesia Quick Evaluation

## 2010-11-30 ENCOUNTER — Inpatient Hospital Stay (HOSPITAL_COMMUNITY): Payer: BC Managed Care – PPO

## 2010-11-30 MED ORDER — ZOLPIDEM TARTRATE 10 MG PO TABS: 10.0000 mg | ORAL_TABLET | Freq: Every evening | ORAL | Status: DC | PRN

## 2010-11-30 MED ORDER — SODIUM CHLORIDE 0.9 % IV SOLN: 250.0000 mL | INTRAVENOUS | Status: DC

## 2010-11-30 MED ORDER — ACETAMINOPHEN 650 MG RE SUPP: 650.0000 mg | RECTAL | Status: DC | PRN

## 2010-11-30 MED ORDER — MENTHOL 3 MG MT LOZG: 1.0000 | LOZENGE | OROMUCOSAL | Status: DC | PRN

## 2010-11-30 MED ORDER — SODIUM CHLORIDE 0.9 % IJ SOLN: 3.0000 mL | INTRAMUSCULAR | Status: DC | PRN

## 2010-11-30 MED ORDER — CEFAZOLIN SODIUM 1-5 GM-% IV SOLN
1.0000 g | Freq: Three times a day (TID) | INTRAVENOUS | Status: AC
  Administered 2010-11-30 (×2): 1 g via INTRAVENOUS
  Filled 2010-11-30 (×2): qty 50

## 2010-11-30 MED ORDER — PROMETHAZINE HCL 25 MG/ML IJ SOLN
25.0000 mg | Freq: Four times a day (QID) | INTRAMUSCULAR | Status: DC | PRN
  Administered 2010-11-30 – 2010-12-02 (×7): 25 mg via INTRAVENOUS
  Filled 2010-11-30 (×7): qty 1

## 2010-11-30 MED ORDER — SODIUM CHLORIDE 0.9 % IJ SOLN: 3.0000 mL | Freq: Two times a day (BID) | INTRAMUSCULAR | Status: DC

## 2010-11-30 MED ORDER — PANTOPRAZOLE SODIUM 40 MG IV SOLR
40.0000 mg | Freq: Every day | INTRAVENOUS | Status: DC
  Administered 2010-11-30 – 2010-12-04 (×5): 40 mg via INTRAVENOUS
  Filled 2010-11-30 (×6): qty 40

## 2010-11-30 MED ORDER — PHENOL 1.4 % MT LIQD: 1.0000 | OROMUCOSAL | Status: DC | PRN

## 2010-11-30 MED ORDER — DOCUSATE SODIUM 100 MG PO CAPS: 100.0000 mg | ORAL_CAPSULE | Freq: Two times a day (BID) | ORAL | Status: DC

## 2010-11-30 MED ORDER — ONDANSETRON HCL 4 MG/2ML IJ SOLN
4.0000 mg | INTRAMUSCULAR | Status: DC | PRN
  Administered 2010-11-30 (×2): 4 mg via INTRAVENOUS
  Filled 2010-11-30 (×2): qty 2

## 2010-11-30 MED ORDER — PROMETHAZINE HCL 25 MG/ML IJ SOLN
12.5000 mg | Freq: Four times a day (QID) | INTRAMUSCULAR | Status: DC | PRN
  Administered 2010-11-30: 12.5 mg via INTRAVENOUS
  Filled 2010-11-30: qty 1

## 2010-11-30 MED ORDER — ACETAMINOPHEN 325 MG PO TABS: 650.0000 mg | ORAL_TABLET | ORAL | Status: DC | PRN

## 2010-11-30 MED ORDER — MORPHINE SULFATE 4 MG/ML IJ SOLN
1.0000 mg | INTRAMUSCULAR | Status: DC | PRN
  Administered 2010-11-30: 2 mg via INTRAVENOUS
  Administered 2010-11-30 – 2010-12-01 (×5): 4 mg via INTRAVENOUS
  Administered 2010-12-02: 2 mg via INTRAVENOUS
  Filled 2010-11-30 (×7): qty 1

## 2010-11-30 NOTE — Progress Notes (Signed)
Subjective: Patient reports Pain is localized to his back he denies any pain in his legs no new numbness or tingling. he did have a bit of a rough night but feels better this morning. He did not get fitted for a lumbar corset which we'll obtain for him. He feels like he is getting nauseated from the IV delighted Lynnea Ferrier DC the PCA. Objective: Vital signs in last 24 hours: Temp:  [97.2 F (36.2 C)-98.6 F (37 C)] 98.5 F (36.9 C) (11/22 0959) Pulse Rate:  [52-84] 75  (11/22 0959) Resp:  [16-20] 18  (11/22 0959) BP: (109-132)/(69-86) 118/79 mmHg (11/22 0959) SpO2:  [94 %-100 %] 97 % (11/22 0959) Weight:  [84.823 kg (187 lb)] 187 lb (84.823 kg) (11/21 1517)  Intake/Output from previous day: 11/21 0701 - 11/22 0700 In: 2206 [I.V.:1706; IV Piggyback:500] Out: 670 [Urine:250; Drains:170; Blood:250] Intake/Output this shift:    He is awake and alert strength is 5 out of 5 distal in his lower extremities and his wound is clean and dry.  Lab Results: No results found for this basename: WBC:2,HGB:2,HCT:2,PLT:2 in the last 72 hours BMET No results found for this basename: NA:2,K:2,CL:2,CO2:2,GLUCOSE:2,BUN:2,CREATININE:2,CALCIUM:2 in the last 72 hours  Studies/Results: Dg Lumbar Spine 2-3 Views  11/29/2010  *RADIOLOGY REPORT*  Clinical Data: 60 year old male undergoing lumbar surgery.  LUMBAR SPINE - 2-3 VIEW  Comparison: Lumbar MRI 06/11/2010.  Fluoroscopy time of 0.5 minutes was utilized.  Findings: Intraoperative fluoroscopic views of the lower lumbar spine in the frontal and lateral projection.  Sequelae of posterior and interbody fusion at L4-L5.  Sequelae of decompression at that level.  IMPRESSION: Fusion and decompression at L4-L5.  Original Report Authenticated By: Harley Hallmark, M.D.   Dg C-arm Gt 120 Min  11/29/2010  CLINICAL DATA: plif l4-5   C-ARM GT 120 MIN  Fluoroscopy was utilized by the requesting physician.  No radiographic  interpretation.      Assessment/Plan: We'll  progress to mobilize the patient today with physical therapy, we'll obtain a lumbar corset, will DC his PCA and start him on intermittent morphine as well as his hydrocodone from home.  LOS: 1 day     Herbie Lehrmann P 11/30/2010, 10:34 AM

## 2010-12-01 MED ORDER — DEXTROSE 50 % IV SOLN
INTRAVENOUS | Status: AC
  Filled 2010-12-01: qty 50

## 2010-12-01 MED ORDER — BISACODYL 5 MG PO TBEC
5.0000 mg | DELAYED_RELEASE_TABLET | Freq: Every day | ORAL | Status: DC | PRN
  Administered 2010-12-01 – 2010-12-03 (×2): 5 mg via ORAL
  Filled 2010-12-01 (×2): qty 1

## 2010-12-01 NOTE — Progress Notes (Signed)
Subjective: Patient reports that he feels slightly better this morning with less nausea. He denies any numbness and tingling and tingling in his legs. He denies any pain in his legs. He also is not passing gas.  Objective: Vital signs in last 24 hours: Temp:  [97.8 F (36.6 C)-98.8 F (37.1 C)] 97.8 F (36.6 C) (11/23 0627) Pulse Rate:  [75-98] 95  (11/23 0627) Resp:  [18-22] 22  (11/23 0627) BP: (116-149)/(74-83) 149/81 mmHg (11/23 0627) SpO2:  [92 %-97 %] 95 % (11/23 0627)  Intake/Output from previous day: 11/22 0701 - 11/23 0700 In: -  Out: 780 [Urine:725; Drains:55] Intake/Output this shift:    His strength is 5 out of 5 in his upper and lower extremities. His abdomen is distended. Bowel sounds are present.  Lab Results: No results found for this basename: WBC:2,HGB:2,HCT:2,PLT:2 in the last 72 hours BMET No results found for this basename: NA:2,K:2,CL:2,CO2:2,GLUCOSE:2,BUN:2,CREATININE:2,CALCIUM:2 in the last 72 hours  Studies/Results: Dg Lumbar Spine 2-3 Views  11/29/2010  *RADIOLOGY REPORT*  Clinical Data: 60 year old male undergoing lumbar surgery.  LUMBAR SPINE - 2-3 VIEW  Comparison: Lumbar MRI 06/11/2010.  Fluoroscopy time of 0.5 minutes was utilized.  Findings: Intraoperative fluoroscopic views of the lower lumbar spine in the frontal and lateral projection.  Sequelae of posterior and interbody fusion at L4-L5.  Sequelae of decompression at that level.  IMPRESSION: Fusion and decompression at L4-L5.  Original Report Authenticated By: Harley Hallmark, M.D.   Dg Abd 1 View  11/30/2010  *RADIOLOGY REPORT*  Clinical Data: Postop L4-5 PLIF.  Abdominal pain.  ABDOMEN - 1 VIEW 11/30/2010:  Comparison: CT abdomen and pelvis 05/22/2005 [facility].  Findings: Mild to moderate gaseous distention of the colon and multiple loops of small bowel throughout the abdomen.  No evidence of free air on the supine image.  Postsurgical changes related to the L4-5 PLIF.  IMPRESSION:  Moderate generalized ileus.  Original Report Authenticated By: Arnell Sieving, M.D.   Dg C-arm Gt 120 Min  11/29/2010  CLINICAL DATA: plif l4-5   C-ARM GT 120 MIN  Fluoroscopy was utilized by the requesting physician.  No radiographic  interpretation.      Assessment/Plan: Postop lumbar fusion with ileus. Continue n.p.o. except for sips of water and ice chips. Ambulate.  LOS: 2 days     Todd Carpenter P 12/01/2010, 9:41 AM

## 2010-12-02 MED ORDER — HYDROMORPHONE HCL PF 1 MG/ML IJ SOLN
1.0000 mg | INTRAMUSCULAR | Status: DC | PRN
  Administered 2010-12-02: 2 mg via INTRAVENOUS
  Administered 2010-12-02: 1 mg via INTRAVENOUS
  Administered 2010-12-02 – 2010-12-03 (×4): 2 mg via INTRAVENOUS
  Administered 2010-12-03: 1 mg via INTRAVENOUS
  Administered 2010-12-04: 2 mg via INTRAVENOUS
  Filled 2010-12-02: qty 1
  Filled 2010-12-02 (×4): qty 2
  Filled 2010-12-02 (×4): qty 1
  Filled 2010-12-02: qty 2

## 2010-12-02 NOTE — Progress Notes (Signed)
Subjective:  The patient is alert and pleasant. He has been passing gas.  Objective: Vital signs in last 24 hours: Temp:  [97.5 F (36.4 C)-99.7 F (37.6 C)] 99 F (37.2 C) (11/24 0600) Pulse Rate:  [70-97] 91  (11/24 0600) Resp:  [18-22] 20  (11/24 0600) BP: (125-145)/(79-89) 143/79 mmHg (11/24 0600) SpO2:  [90 %-98 %] 92 % (11/24 0600)  Intake/Output from previous day: 11/23 0701 - 11/24 0700 In: 900 [I.V.:900] Out: 1600 [Urine:1600] Intake/Output this shift:    Physical exam patient is alert and oriented. His lower extremity motor strength is grossly normal. His abdomen is soft.  Lab Results: No results found for this basename: WBC:2,HGB:2,HCT:2,PLT:2 in the last 72 hours BMET No results found for this basename: NA:2,K:2,CL:2,CO2:2,GLUCOSE:2,BUN:2,CREATININE:2,CALCIUM:2 in the last 72 hours  Studies/Results: Dg Abd 1 View  11/30/2010  *RADIOLOGY REPORT*  Clinical Data: Postop L4-5 PLIF.  Abdominal pain.  ABDOMEN - 1 VIEW 11/30/2010:  Comparison: CT abdomen and pelvis 05/22/2005 [facility].  Findings: Mild to moderate gaseous distention of the colon and multiple loops of small bowel throughout the abdomen.  No evidence of free air on the supine image.  Postsurgical changes related to the L4-5 PLIF.  IMPRESSION: Moderate generalized ileus.  Original Report Authenticated By: Arnell Sieving, M.D.    Assessment/Plan: Postop day #3: The patient seems to be making progress. He will possibly go home tomorrow.  Ileus: This seems to be resolving.  LOS: 3 days     Taiden Raybourn D 12/02/2010, 10:11 AM

## 2010-12-03 NOTE — Progress Notes (Signed)
Subjective:  The patient is alert and pleasant. He complains he is not getting his pain medications when he asks for them. He has been passing gas.  Objective: Vital signs in last 24 hours: Temp:  [98.4 F (36.9 C)-99.2 F (37.3 C)] 98.5 F (36.9 C) (11/25 0626) Pulse Rate:  [87-95] 88  (11/25 0626) Resp:  [18-20] 18  (11/25 0626) BP: (126-144)/(74-89) 143/83 mmHg (11/25 0626) SpO2:  [90 %-92 %] 91 % (11/25 0626)  Intake/Output from previous day: 11/24 0701 - 11/25 0700 In: 3600 [P.O.:900; I.V.:2700] Out: 1550 [Urine:1550] Intake/Output this shift:    Physical exam the patient is alert and oriented. His abdomen is soft. His lower extremity motor strength is normal.  Lab Results: No results found for this basename: WBC:2,HGB:2,HCT:2,PLT:2 in the last 72 hours BMET No results found for this basename: NA:2,K:2,CL:2,CO2:2,GLUCOSE:2,BUN:2,CREATININE:2,CALCIUM:2 in the last 72 hours  Studies/Results: No results found.  Assessment/Plan: Postop day #4: I encouraged the patient to mobilize with physical therapy.  LOS: 4 days     Savahna Casados D 12/03/2010, 9:50 AM

## 2010-12-03 NOTE — Progress Notes (Signed)
Physical Therapy Evaluation Patient Details Name: ZAIAH ECKERSON MRN: [id number] DOB: [birth year] Today's Date: 12/03/2010  Problem List:  Patient Active Problem List  Diagnoses  . BENIGN PROSTATIC HYPERTROPHY  . PSORIASIS  . ERECTILE DYSFUNCTION  . CERVICAL DISORDER, NOS    Past Medical History:  Past Medical History  Diagnosis Date  . BENIGN PROSTATIC HYPERTROPHY 09/18/2006  . PSORIASIS 07/14/2007  . Degenerative disc disease    Past Surgical History:  Past Surgical History  Procedure Date  . Neck surgery 03/16/2010  . Back surgery 1985    PT Assessment/Plan/Recommendation PT Assessment Clinical Impression Statement: Pt s/p lumbar fusion with resultant pain and problems belowl.  Will benefit from PT to maximize independence with mobility prior to d/c home with girlfriend. PT Recommendation/Assessment: Patient will need skilled PT in the acute care venue PT Problem List: Decreased activity tolerance;Decreased mobility;Decreased knowledge of use of DME;Decreased knowledge of precautions;Pain Barriers to Discharge: None PT Therapy Diagnosis : Difficulty walking;Acute pain PT Plan PT Frequency: Min 5X/week PT Treatment/Interventions: DME instruction;Gait training;Stair training;Functional mobility training;Patient/family education PT Recommendation Recommendations for Other Services: OT consult Follow Up Recommendations: Home health PT;24 hour supervision/assistance Equipment Recommended: None recommended by PT PT Goals  Acute Rehab PT Goals PT Goal Formulation: With patient Pt will Roll Supine to Left Side: with supervision (no rail) PT Goal: Rolling Supine to Left Side - Progress: Other (comment) Pt will go Supine/Side to Sit: with supervision (no rail) PT Goal: Supine/Side to Sit - Progress: Other (comment) Pt will go Sit to Supine/Side: with supervision PT Goal: Sit to Supine/Side - Progress: Other (comment) Pt will Transfer Sit to Stand/Stand to Sit: with  supervision;with upper extremity assist PT Transfer Goal: Sit to Stand/Stand to Sit - Progress: Other (comment) Pt will Ambulate: 51 - 150 feet;with supervision;with least restrictive assistive device PT Goal: Ambulate - Progress: Other (comment) Pt will Go Up / Down Stairs: 3-5 stairs;with min assist;with least restrictive assistive device PT Goal: Up/Down Stairs - Progress: Other (comment) Additional Goals Additional Goal #1: Pt will verbalize and demonstrate adherence to back precautions during mobility PT Goal: Additional Goal #1 - Progress: Other (comment)  PT Evaluation Precautions/Restrictions  Precautions Precautions: Back Precaution Booklet Issued: Yes (comment) Required Braces or Orthoses: Yes Spinal Brace: Lumbar corset;Applied in sitting position Prior Functioning  Home Living Lives With: Significant other Receives Help From: Other (Comment) (girlfriend lives with him; available 24/7) Type of Home: House Home Layout: One level Home Access: Stairs to enter Entrance Stairs-Rails: None Entrance Stairs-Number of Steps: 3 Bathroom Shower/Tub: Forensic scientist: Standard Home Adaptive Equipment: Bedside commode/3-in-1;Straight cane;Walker - rolling Prior Function Level of Independence: Independent with basic ADLs;Independent with gait Cognition Cognition Arousal/Alertness: Awake/alert Overall Cognitive Status: Appears within functional limits for tasks assessed Orientation Level: Oriented X4 Sensation/Coordination   Extremity Assessment RLE Assessment RLE Assessment: Within Functional Limits LLE Assessment LLE Assessment: Within Functional Limits Mobility (including Balance) Bed Mobility Rolling Left: 4: Min assist;With rail Rolling Left Details (indicate cue type and reason): vc to maintain back precautions Left Sidelying to Sit: 4: Min assist;With rails;HOB flat Left Sidelying to Sit Details (indicate cue type and reason): vc to maintain  back precautions Sit to Supine - Left: 4: Min assist;With rail;HOB flat Sit to Supine - Left Details (indicate cue type and reason): vc to maintain back precautions Transfers Sit to Stand: 4: Min assist;From elevated surface;With upper extremity assist;From bed Sit to Stand Details (indicate cue type and reason): bed elevated ~ 6 inches; pt  with one hand on RW with PT stabilizing RW Stand to Sit: 4: Min assist;With upper extremity assist;To elevated surface;To bed Stand to Sit Details: vc for safe use of RW Ambulation/Gait Ambulation/Gait: Yes Ambulation/Gait Assistance: 4: Min assist Ambulation/Gait Assistance Details (indicate cue type and reason): vc for use of RW; very slow cadence; incr reliance on UEs Ambulation Distance (Feet): 100 Feet Assistive device: Rolling walker Stairs: No  Posture/Postural Control Posture/Postural Control: No significant limitations Exercise    End of Session PT - End of Session Equipment Utilized During Treatment: Gait belt;Back brace Activity Tolerance: Patient limited by pain Patient left: in bed;with call bell in reach;with family/visitor present (pt refused to sit in chair due to incr pain 11/24) Nurse Communication: Mobility status for ambulation General Behavior During Session: [facility] for tasks performed Cognition: [facility] for tasks performed  Tavon Magnussen 12/03/2010, 2:56 PM Pager [phone]

## 2010-12-04 NOTE — Progress Notes (Signed)
Subjective: Patient reports He is doing better today with improved back pain improved leg pain less medication tolerance he has been angulating in his way and voiding spontaneously. He is passing gas. It is not a bowel movement  Objective: Vital signs in last 24 hours: Temp:  [98.1 F (36.7 C)-98.8 F (37.1 C)] 98.4 F (36.9 C) (11/26 1020) Pulse Rate:  [81-98] 87  (11/26 1020) Resp:  [16-20] 20  (11/26 1020) BP: (125-163)/(70-91) 148/83 mmHg (11/26 1020) SpO2:  [88 %-95 %] 93 % (11/26 1020)  Intake/Output from previous day: 11/25 0701 - 11/26 0700 In: -  Out: 700 [Urine:700] Intake/Output this shift: Total I/O In: 240 [P.O.:240] Out: 800 [Urine:800]  Strength is 5 out of 5 his lower extremities and his wound is clean and dry.  Lab Results: No results found for this basename: WBC:2,HGB:2,HCT:2,PLT:2 in the last 72 hours BMET No results found for this basename: NA:2,K:2,CL:2,CO2:2,GLUCOSE:2,BUN:2,CREATININE:2,CALCIUM:2 in the last 72 hours  Studies/Results: No results found.  Assessment/Plan: Continue mobilizes afternoon well and his diet his ileus is resolved continued ambulated A. with possible discharge tomorrow  LOS: 5 days     Todd Carpenter P 12/04/2010, 2:51 PM

## 2010-12-04 NOTE — Progress Notes (Signed)
Utilization review completed. Odessie Polzin, RN, BSN. 12/04/10  

## 2010-12-04 NOTE — Progress Notes (Signed)
Physical Therapy Treatment Patient Details Name: Todd Carpenter MRN: [id number] DOB: July 17, 1950 Today's Date: 12/04/2010  PT Assessment/Plan  PT - Assessment/Plan Comments on Treatment Session: Pain seems to be limiting factor in mobilty PT Plan: Discharge plan remains appropriate PT Frequency: Min 5X/week Follow Up Recommendations: Home health PT;24 hour supervision/assistance Equipment Recommended: None recommended by PT PT Goals  Acute Rehab PT Goals PT Goal: Rolling Supine to Left Side - Progress: Progressing toward goal PT Goal: Supine/Side to Sit - Progress: Progressing toward goal PT Transfer Goal: Sit to Stand/Stand to Sit - Progress: Progressing toward goal PT Goal: Ambulate - Progress: Progressing toward goal Additional Goals PT Goal: Additional Goal #1 - Progress: Progressing toward goal  PT Treatment Precautions/Restrictions  Precautions Precautions: Back Precaution Booklet Issued: Yes (comment) Precaution Comments: Pt unable to recall back precautions at beginning of sesion.  Reviewed all 3 precautions.  Pt able to verbalize 3/3 precautions at end of sesion Required Braces or Orthoses: Yes Spinal Brace: Lumbar corset;Applied in sitting position (required min A/cueing for donning) Restrictions Weight Bearing Restrictions: No Mobility (including Balance) Bed Mobility Rolling Left: Other (comment);With rail Rolling Left Details (indicate cue type and reason): cues for sequencing, technique, & maintain back precautions Left Sidelying to Sit: 4: Min assist;With rails Left Sidelying to Sit Details (indicate cue type and reason): min assist at end range of tramsitioning wt to evenly distribulte onto both hips Transfers Sit to Stand: Other (comment);With upper extremity assist;From elevated surface;From bed (min guard) Sit to Stand Details (indicate cue type and reason): increased time to initiate standing; bed elevated;  Stand to Sit: 4: Min assist;With upper extremity  assist;To chair/3-in-1;With armrests Stand to Sit Details: assistance to control descent, cues for safe technique Ambulation/Gait Ambulation/Gait Assistance: 4: Min assist Ambulation/Gait Assistance Details (indicate cue type and reason): cues to stay inside RW, increase floor clearance right foot; relies heavily on RW  Ambulation Distance (Feet): 120 Feet Assistive device: Rolling walker Gait Pattern: Decreased weight shift to left;Decreased stance time - left;Decreased hip/knee flexion - left;Decreased stride length;Decreased step length - left    Exercise    End of Session PT - End of Session Equipment Utilized During Treatment: Gait belt;Back brace Activity Tolerance: Patient limited by pain Patient left: in chair;with call bell in reach;with family/visitor present General Behavior During Session: [facility] for tasks performed Cognition: [facility] for tasks performed  Lara Mulch 12/04/2010, 2:12 PM

## 2010-12-05 MED ORDER — BISACODYL 10 MG RE SUPP
10.0000 mg | Freq: Every day | RECTAL | Status: DC | PRN
  Administered 2010-12-05: 10 mg via RECTAL
  Filled 2010-12-05: qty 1

## 2010-12-05 MED ORDER — CYCLOBENZAPRINE HCL 10 MG PO TABS: 10.0000 mg | ORAL_TABLET | Freq: Three times a day (TID) | ORAL | Status: AC | PRN

## 2010-12-05 MED ORDER — OXYCODONE-ACETAMINOPHEN 5-325 MG PO TABS: 1.0000 | ORAL_TABLET | ORAL | Status: AC | PRN

## 2010-12-05 NOTE — Progress Notes (Signed)
Patient in no signs of acute distress. Patient and his wife were given discharge education, medication education and follow up information. They had no further questions. April Manson, RN 12/05/2010 1230

## 2010-12-05 NOTE — Progress Notes (Signed)
Subjective: Patient reports pt doing well with pain control still no BM  Objective: Vital signs in last 24 hours: Temp:  [97.8 F (36.6 C)-99.1 F (37.3 C)] 98.2 F (36.8 C) (11/27 0600) Pulse Rate:  [86-99] 86  (11/27 0600) Resp:  [16-20] 16  (11/27 0600) BP: (134-148)/(82-92) 137/89 mmHg (11/27 0600) SpO2:  [89 %-95 %] 90 % (11/27 0600)  Intake/Output from previous day: 11/26 0701 - 11/27 0700 In: 480 [P.O.:480] Out: 2900 [Urine:2900] Intake/Output this shift: Total I/O In: -  Out: 250 [Urine:250]  neurologically intact would dry  Lab Results: No results found for this basename: WBC:2,HGB:2,HCT:2,PLT:2 in the last 72 hours BMET No results found for this basename: NA:2,K:2,CL:2,CO2:2,GLUCOSE:2,BUN:2,CREATININE:2,CALCIUM:2 in the last 72 hours  Studies/Results: No results found.  Assessment/Plan: D/C home  LOS: 6 days     Lexus Barletta P 12/05/2010, 8:10 AM

## 2010-12-05 NOTE — Discharge Summary (Signed)
Physician Discharge Summary  Patient ID: Todd Carpenter MRN: [id number] DOB/AGE: June 17, 1950 60 y.o.  Admit date: 11/29/2010 Discharge date: 12/05/2010  Admission Diagnoses: Lumbar degenerative disease L4-5   Discharge Diagnoses: Same Active Problems:  * No active hospital problems. *    Discharged Condition: good  Hospital Course: Patient was admitted as an early morning of admission went to the upper operating room and underwent an L4-5 posterior lumbar interbody fusion. He did very well went to the recovery room then to the floor on the floor over the next couple days he recovered very well from surgery however he developed a postoperative ileus. He was kept n.p.o. except for clear liquids the ileus did resolve he began passing gas we begin to advance his diet. At the time of discharge his pain was reasonably well controlled on oral pain medication he was having no radicular symptoms. Will be given him a Dulcolax suppository prior to discharge try to stimulate his bowels to work he will followup with me in approximately one week.   Consults: none  Significant Diagnostic Studies:    Treatments: surgery: Posterior lumbar interbody fusion L4-5  Discharge Exam: Blood pressure 137/89, pulse 86, temperature 98.2 F (36.8 C), temperature source Oral, resp. rate 16, height 6' (1.829 m), weight 84.823 kg (187 lb), SpO2 90.00%. Patient is awake alert oriented ambulate well strength is 5 out of 5 in his lower 70s in his wound is clean and dry.  Disposition: Final discharge disposition not confirmed   Current Discharge Medication List    START taking these medications   Details  cyclobenzaprine (FLEXERIL) 10 MG tablet Take 1 tablet (10 mg total) by mouth 3 (three) times daily as needed for muscle spasms. Qty: 80 tablet, Refills: 1    oxyCODONE-acetaminophen (PERCOCET) 5-325 MG per tablet Take 1-2 tablets by mouth every 4 (four) hours as needed. Qty: 80 tablet, Refills: 0      CONTINUE  these medications which have NOT CHANGED   Details  calcipotriene (DOVONOX) 0.005 % cream Apply topically 2 (two) times daily.      fluocinonide (LIDEX) 0.05 % cream Apply topically 2 (two) times daily.      HYDROcodone-acetaminophen (VICODIN ES) 7.5-750 MG per tablet Take 1 tablet by mouth every 6 (six) hours as needed for Pain. Qty: 50 tablet, Refills: 1   Associated Diagnoses: Unspecified musculoskeletal disorders and symptoms referable to neck    sildenafil (VIAGRA) 50 MG tablet Take 50 mg by mouth daily as needed. ed       Follow-up Information    Follow up with Kassadie Pancake P in 1 week.   Contact information:   [address] [place] [address] [phone]          Signed: Mariam Dollar 12/05/2010, 8:35 AM

## 2010-12-06 MED FILL — Sodium Chloride IV Soln 0.9%: INTRAVENOUS | Qty: 1000 | Status: AC

## 2010-12-06 MED FILL — Heparin Sodium (Porcine) Inj 1000 Unit/ML: INTRAMUSCULAR | Qty: 30 | Status: AC

## 2011-01-10 ENCOUNTER — Other Ambulatory Visit: Payer: Self-pay | Admitting: Neurosurgery

## 2011-01-10 ENCOUNTER — Ambulatory Visit
Admission: RE | Admit: 2011-01-10 | Discharge: 2011-01-10 | Disposition: A | Discharge status: Home / Self Care | Payer: BC Managed Care – PPO | Source: Ambulatory Visit | Attending: Neurosurgery | Admitting: Neurosurgery

## 2011-01-10 DIAGNOSIS — M545 Low back pain: Secondary | ICD-10-CM

## 2011-02-22 ENCOUNTER — Ambulatory Visit
Admission: RE | Admit: 2011-02-22 | Discharge: 2011-02-22 | Disposition: A | Discharge status: Home / Self Care | Payer: BC Managed Care – PPO | Source: Ambulatory Visit | Attending: Neurosurgery | Admitting: Neurosurgery

## 2011-02-22 ENCOUNTER — Other Ambulatory Visit: Payer: Self-pay | Admitting: Neurosurgery

## 2011-02-22 DIAGNOSIS — M545 Low back pain: Secondary | ICD-10-CM

## 2011-04-05 ENCOUNTER — Ambulatory Visit
Admission: RE | Admit: 2011-04-05 | Discharge: 2011-04-05 | Disposition: A | Discharge status: Home / Self Care | Payer: BC Managed Care – PPO | Source: Ambulatory Visit | Attending: Neurosurgery | Admitting: Neurosurgery

## 2011-04-05 ENCOUNTER — Other Ambulatory Visit: Payer: Self-pay | Admitting: Neurosurgery

## 2011-04-05 DIAGNOSIS — M545 Low back pain: Secondary | ICD-10-CM

## 2011-08-13 ENCOUNTER — Other Ambulatory Visit: Payer: BC Managed Care – PPO

## 2011-08-20 ENCOUNTER — Encounter: Payer: BC Managed Care – PPO | Admitting: Family Medicine

## 2011-09-07 ENCOUNTER — Other Ambulatory Visit (INDEPENDENT_AMBULATORY_CARE_PROVIDER_SITE_OTHER): Payer: BC Managed Care – PPO

## 2011-09-07 DIAGNOSIS — Z0000 Encounter for general adult medical examination without abnormal findings: Secondary | ICD-10-CM

## 2011-09-07 LAB — POCT URINALYSIS DIPSTICK
Bilirubin, UA: NEGATIVE
Blood, UA: NEGATIVE
Glucose, UA: NEGATIVE
Ketones, UA: NEGATIVE
Spec Grav, UA: 1.01
Urobilinogen, UA: 0.2

## 2011-09-07 LAB — HEPATIC FUNCTION PANEL
ALT: 22 U/L (ref 0–53)
AST: 22 U/L (ref 0–37)
Albumin: 4 g/dL (ref 3.5–5.2)
Total Protein: 6.4 g/dL (ref 6.0–8.3)

## 2011-09-07 LAB — BASIC METABOLIC PANEL
BUN: 16 mg/dL (ref 6–23)
Chloride: 99 mEq/L (ref 96–112)
GFR: 75.11 mL/min (ref >60.00)
Potassium: 4 mEq/L (ref 3.5–5.1)
Sodium: 134 mEq/L — ABNORMAL LOW (ref 135–145)

## 2011-09-07 LAB — TSH: TSH: 2.4 u[IU]/mL (ref 0.35–5.50)

## 2011-09-07 LAB — CBC WITH DIFFERENTIAL/PLATELET
Basophils Relative: 0.7 % (ref 0.0–3.0)
Eosinophils Relative: 5.9 % — ABNORMAL HIGH (ref 0.0–5.0)
HCT: 39.7 % (ref 39.0–52.0)
Hemoglobin: 13.2 g/dL (ref 13.0–17.0)
Lymphs Abs: 1.1 10*3/uL (ref 0.7–4.0)
MCV: 96.3 fl (ref 78.0–100.0)
Monocytes Absolute: 0.4 10*3/uL (ref 0.1–1.0)
Monocytes Relative: 7.6 % (ref 3.0–12.0)
Neutro Abs: 3.3 10*3/uL (ref 1.4–7.7)
Platelets: 270 10*3/uL (ref 150.0–400.0)
WBC: 5.2 10*3/uL (ref 4.5–10.5)

## 2011-09-07 LAB — PSA: PSA: 1.05 ng/mL (ref 0.10–4.00)

## 2011-09-07 LAB — LIPID PANEL
Cholesterol: 120 mg/dL (ref 0–200)
LDL Cholesterol: 64 mg/dL (ref 0–99)
Triglycerides: 39 mg/dL (ref 0.0–149.0)

## 2011-09-18 ENCOUNTER — Encounter: Payer: Self-pay | Admitting: Family Medicine

## 2011-09-18 ENCOUNTER — Ambulatory Visit (INDEPENDENT_AMBULATORY_CARE_PROVIDER_SITE_OTHER): Payer: BC Managed Care – PPO | Admitting: Family Medicine

## 2011-09-18 VITALS — BP 110/72 | Temp 97.9°F | Ht 71.25 in | Wt 171.0 lb

## 2011-09-18 DIAGNOSIS — M5382 Other specified dorsopathies, cervical region: Secondary | ICD-10-CM

## 2011-09-18 DIAGNOSIS — N400 Enlarged prostate without lower urinary tract symptoms: Secondary | ICD-10-CM

## 2011-09-18 DIAGNOSIS — Z23 Encounter for immunization: Secondary | ICD-10-CM

## 2011-09-18 DIAGNOSIS — F528 Other sexual dysfunction not due to a substance or known physiological condition: Secondary | ICD-10-CM

## 2011-09-18 DIAGNOSIS — Z0000 Encounter for general adult medical examination without abnormal findings: Secondary | ICD-10-CM

## 2011-09-18 DIAGNOSIS — L408 Other psoriasis: Secondary | ICD-10-CM

## 2011-09-18 MED ORDER — SILDENAFIL CITRATE 50 MG PO TABS: 50.0000 mg | ORAL_TABLET | Freq: Every day | ORAL | Status: DC | PRN

## 2011-09-18 MED ORDER — FLUOCINONIDE 0.05 % EX CREA: TOPICAL_CREAM | Freq: Two times a day (BID) | CUTANEOUS | Status: DC

## 2011-09-18 MED ORDER — CALCIPOTRIENE 0.005 % EX CREA: TOPICAL_CREAM | Freq: Two times a day (BID) | CUTANEOUS | Status: DC

## 2011-09-18 NOTE — Progress Notes (Signed)
  Subjective:    Patient ID: Todd Carpenter, male    DOB: [birth year], 61 y.o.   MRN: [id number]  HPI Zirbes is a 61 year old male nonsmoker who comes in today for general physical examination  Last year was very eventful he had a cervical fusion and then in November had a L4-L5 fusion. Both operations were done by Dr. Glee Arvin. He states this is the best he felt in years  He uses a combination of Dovonex and Lidex for psoriasis and Viagra when necessary for 73 D.  Tetanus 2009, information given on shingles, seasonal flu shot today   Review of Systems  Constitutional: Negative.   HENT: Negative.   Eyes: Negative.   Respiratory: Negative.   Cardiovascular: Negative.   Gastrointestinal: Negative.   Genitourinary: Negative.   Musculoskeletal: Negative.   Skin: Negative.   Neurological: Negative.   Hematological: Negative.   Psychiatric/Behavioral: Negative.        Objective:   Physical Exam  Constitutional: He is oriented to person, place, and time. He appears well-developed and well-nourished.  HENT:  Head: Normocephalic and atraumatic.  Right Ear: External ear normal.  Left Ear: External ear normal.  Nose: Nose normal.  Mouth/Throat: Oropharynx is clear and moist.  Eyes: Conjunctivae and EOM are normal. Pupils are equal, round, and reactive to light.  Neck: Normal range of motion. Neck supple. No JVD present. No tracheal deviation present. No thyromegaly present.  Cardiovascular: Normal rate, regular rhythm, normal heart sounds and intact distal pulses.  Exam reveals no gallop and no friction rub.   No murmur heard. Pulmonary/Chest: Effort normal and breath sounds normal. No stridor. No respiratory distress. He has no wheezes. He has no rales. He exhibits no tenderness.  Abdominal: Soft. Bowel sounds are normal. He exhibits no distension and no mass. There is no tenderness. There is no rebound and no guarding.  Genitourinary: Rectum normal, prostate normal and penis normal.  Guaiac negative stool. No penile tenderness.  Musculoskeletal: Normal range of motion. He exhibits no edema and no tenderness.       Neck scar from cervical fusion and lumbar scar from L4-L5 fusion  Lymphadenopathy:    He has no cervical adenopathy.  Neurological: He is alert and oriented to person, place, and time. He has normal reflexes. No cranial nerve deficit. He exhibits normal muscle tone.  Skin: Skin is warm and dry. No rash noted. No erythema. No pallor.  Psychiatric: He has a normal mood and affect. His behavior is normal. Judgment and thought content normal.   total body skin exam normal except for psoriatic lesions on the lower extremities        Assessment & Plan:  Healthy male  Psoriasis continue Dovonex and Lidex  Erectile dysfunction continue Viagra  Cervical and lumbar fusions currently 80-90% pain-free and functional

## 2011-09-18 NOTE — Patient Instructions (Addendum)
Continue your current medication  Followup in 1 year sooner if any problems  Strongly consider the shingles vaccine

## 2012-03-21 IMAGING — RF DG LUMBAR SPINE 2-3V
1 series · 2 of 2 positions shown · non-contrast
Comparison: Lumbar MRI 06/11/2010.

Fluoroscopy time of 0.5 minutes was utilized.

CLINICAL DATA: 60-year-old male undergoing lumbar surgery.

LUMBAR SPINE - 2-3 VIEW

[Series 1: run · 2 of 2 slices shown]
[im 1/2]
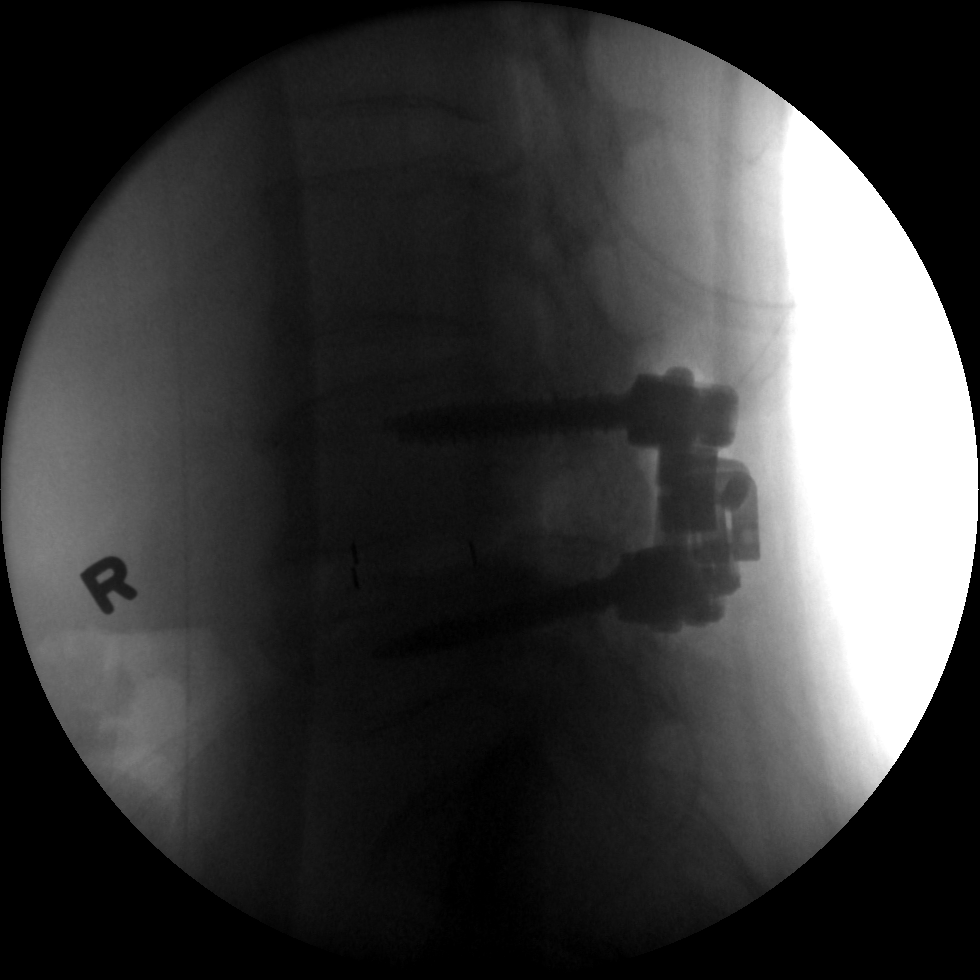
[im 2/2]
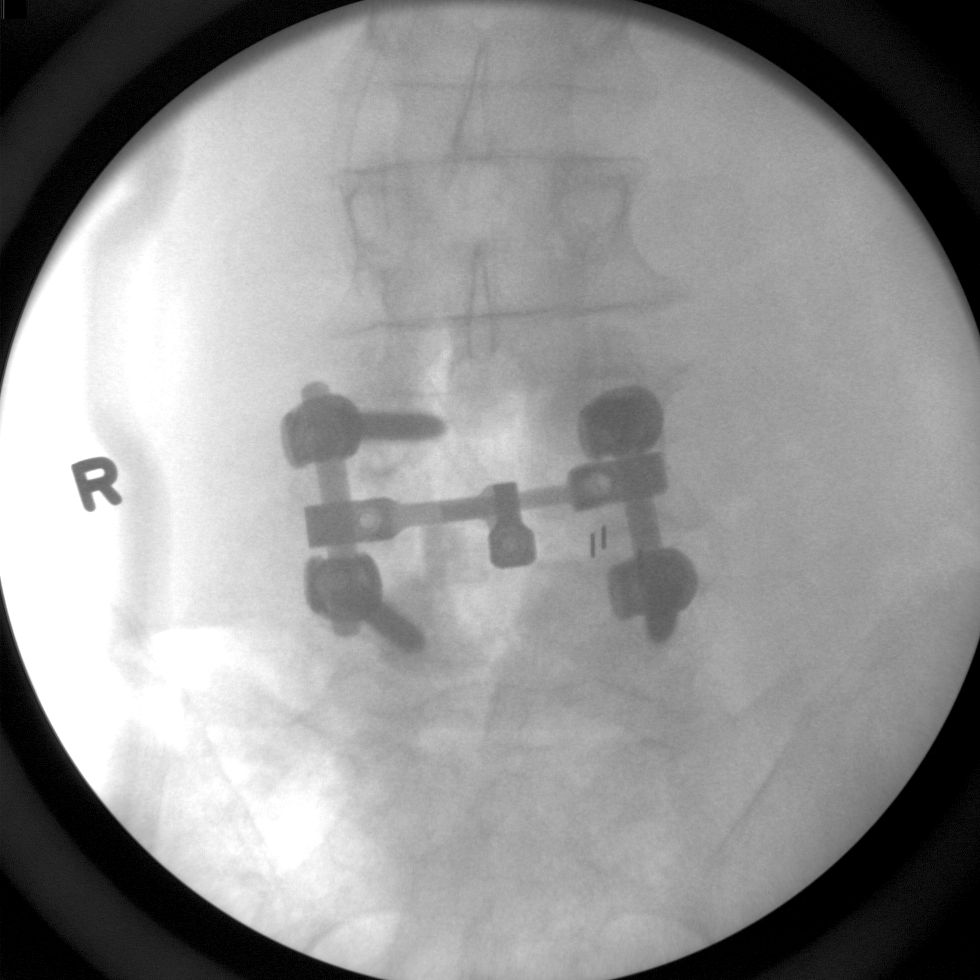

[2 of 2 positions shown; findings below may reference images not displayed]

FINDINGS: Intraoperative fluoroscopic views of the lower lumbar
spine in the frontal and lateral projection.  Sequelae of posterior
and interbody fusion at L4-L5.  Sequelae of decompression at that
level.
IMPRESSION: Fusion and decompression at L4-L5.

## 2012-04-17 ENCOUNTER — Encounter: Payer: Self-pay | Admitting: Gastroenterology

## 2012-05-02 IMAGING — CR DG LUMBAR SPINE 1V
1 series · 1 of 1 positions shown · non-contrast
Comparison: Intraoperative films of 11/29/2010.

CLINICAL DATA: Low back pain.

LUMBAR SPINE - 1 VIEW

[t l-spine lat]
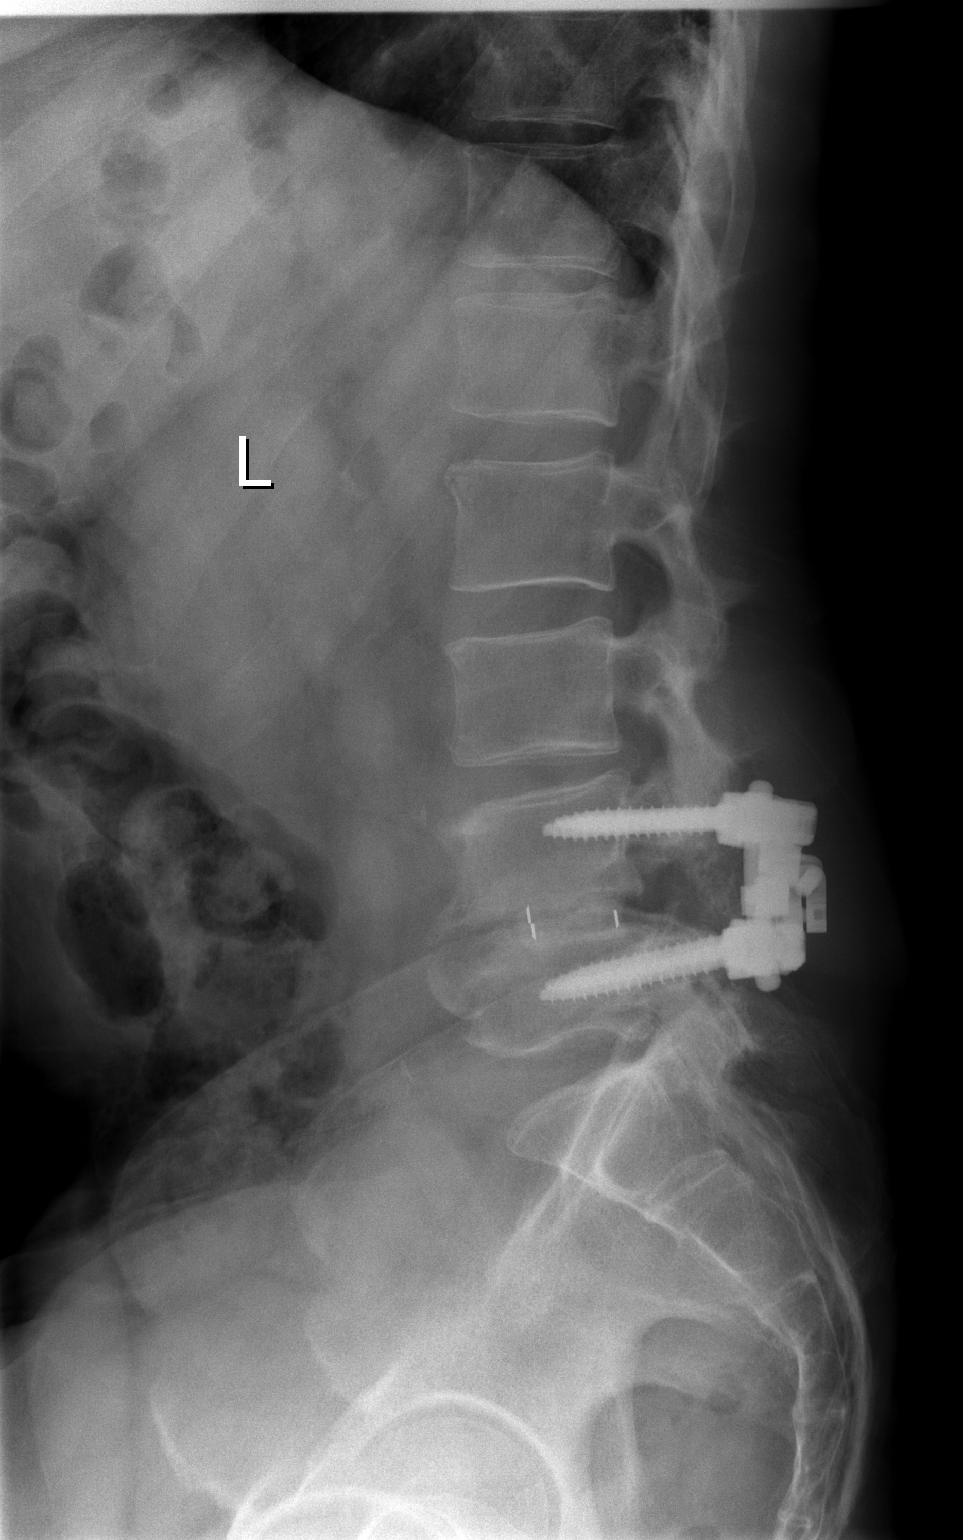

[1 of 1 positions shown; findings below may reference images not displayed]

FINDINGS: There are pedicle screws, posterior rods and interbody
bone spacer fusing L4-5.  No complicating features are
demonstrated.  The lumbar vertebral bodies are normally aligned.
IMPRESSION: Stable appearance and position of the lumbar fusion hardware at L4-
5.

## 2012-08-15 ENCOUNTER — Other Ambulatory Visit: Payer: Self-pay | Admitting: *Deleted

## 2012-08-15 DIAGNOSIS — L408 Other psoriasis: Secondary | ICD-10-CM

## 2012-08-15 MED ORDER — CALCIPOTRIENE 0.005 % EX CREA: TOPICAL_CREAM | Freq: Two times a day (BID) | CUTANEOUS | Status: DC

## 2012-08-15 MED ORDER — FLUOCINONIDE 0.05 % EX CREA: TOPICAL_CREAM | Freq: Two times a day (BID) | CUTANEOUS | Status: DC

## 2012-10-06 ENCOUNTER — Other Ambulatory Visit (INDEPENDENT_AMBULATORY_CARE_PROVIDER_SITE_OTHER): Payer: BC Managed Care – PPO

## 2012-10-06 DIAGNOSIS — Z0000 Encounter for general adult medical examination without abnormal findings: Secondary | ICD-10-CM

## 2012-10-06 LAB — POCT URINALYSIS DIPSTICK
Bilirubin, UA: NEGATIVE
Glucose, UA: NEGATIVE
Ketones, UA: NEGATIVE
Leukocytes, UA: NEGATIVE
Nitrite, UA: NEGATIVE
Spec Grav, UA: 1.015
Urobilinogen, UA: 0.2

## 2012-10-06 LAB — CBC WITH DIFFERENTIAL/PLATELET
Basophils Relative: 0.7 % (ref 0.0–3.0)
Eosinophils Relative: 10.1 % — ABNORMAL HIGH (ref 0.0–5.0)
Hemoglobin: 14.6 g/dL (ref 13.0–17.0)
Lymphocytes Relative: 23.3 % (ref 12.0–46.0)
MCV: 94.9 fl (ref 78.0–100.0)
Neutro Abs: 3.9 10*3/uL (ref 1.4–7.7)
Neutrophils Relative %: 58.5 % (ref 43.0–77.0)
RBC: 4.58 Mil/uL (ref 4.22–5.81)
WBC: 6.6 10*3/uL (ref 4.5–10.5)

## 2012-10-06 LAB — HEPATIC FUNCTION PANEL
ALT: 23 U/L (ref 0–53)
Albumin: 4 g/dL (ref 3.5–5.2)
Bilirubin, Direct: 0.1 mg/dL (ref 0.0–0.3)
Total Bilirubin: 0.6 mg/dL (ref 0.3–1.2)

## 2012-10-06 LAB — LIPID PANEL
HDL: 22.1 mg/dL — ABNORMAL LOW (ref >39.00)
LDL Cholesterol: 63 mg/dL (ref 0–99)
Total CHOL/HDL Ratio: 4
Triglycerides: 32 mg/dL (ref 0.0–149.0)

## 2012-10-06 LAB — BASIC METABOLIC PANEL
Calcium: 9.1 mg/dL (ref 8.4–10.5)
Chloride: 101 mEq/L (ref 96–112)
Creatinine, Ser: 0.9 mg/dL (ref 0.4–1.5)
Sodium: 134 mEq/L — ABNORMAL LOW (ref 135–145)

## 2012-10-06 LAB — PSA: PSA: 1.3 ng/mL (ref 0.10–4.00)

## 2012-10-06 LAB — TSH: TSH: 1.71 u[IU]/mL (ref 0.35–5.50)

## 2012-10-13 ENCOUNTER — Encounter: Payer: Self-pay | Admitting: Family Medicine

## 2012-10-13 ENCOUNTER — Ambulatory Visit (INDEPENDENT_AMBULATORY_CARE_PROVIDER_SITE_OTHER): Payer: BC Managed Care – PPO | Admitting: Family Medicine

## 2012-10-13 VITALS — BP 120/80 | Temp 98.1°F | Ht 71.25 in | Wt 176.0 lb

## 2012-10-13 DIAGNOSIS — Z23 Encounter for immunization: Secondary | ICD-10-CM

## 2012-10-13 DIAGNOSIS — F528 Other sexual dysfunction not due to a substance or known physiological condition: Secondary | ICD-10-CM

## 2012-10-13 DIAGNOSIS — L408 Other psoriasis: Secondary | ICD-10-CM

## 2012-10-13 DIAGNOSIS — Z0000 Encounter for general adult medical examination without abnormal findings: Secondary | ICD-10-CM

## 2012-10-13 DIAGNOSIS — N400 Enlarged prostate without lower urinary tract symptoms: Secondary | ICD-10-CM

## 2012-10-13 MED ORDER — SILDENAFIL CITRATE 50 MG PO TABS: 50.0000 mg | ORAL_TABLET | Freq: Every day | ORAL | Status: DC | PRN

## 2012-10-13 MED ORDER — CALCIPOTRIENE 0.005 % EX CREA: TOPICAL_CREAM | Freq: Two times a day (BID) | CUTANEOUS | Status: DC

## 2012-10-13 MED ORDER — FLUOCINONIDE 0.05 % EX CREA: TOPICAL_CREAM | Freq: Two times a day (BID) | CUTANEOUS | Status: DC

## 2012-10-13 NOTE — Patient Instructions (Addendum)
Continue the Dovonex and Lidex  Viagra 50 mg,,,,,,, [email or web address]  Get an ear wax kit,,,,,,,,,,, 2 drops left ear canal,,,,,,, packed with cotton at bedtime  Remove the cotton in the morning  After 2 weeks then flushed your left ear canal with warm water  Return in one year for general physical exam sooner if any problems

## 2012-10-13 NOTE — Progress Notes (Signed)
  Subjective:    Patient ID: Todd Carpenter, male    DOB: [birth year], 62 y.o.   MRN: [id number]  HPI Todd Carpenter is a 62 year old male nonsmoker who comes in today for his annual physical examination because of a history of psoriasis and erectile dysfunction  He gets routine eye care, dental care, colonoscopy at age 5 normal  He uses Dovonex and Lidex for his psoriasis mainly involved than the right and left lower extremities.  He's Viagra 50 mg one half tab when necessary  He states he feels well and has no complaints  Flu shot given today tetanus booster 2009   Review of Systems  Constitutional: Negative.   HENT: Negative.   Eyes: Negative.   Respiratory: Negative.   Cardiovascular: Negative.   Gastrointestinal: Negative.   Genitourinary: Negative.   Musculoskeletal: Negative.   Skin: Negative.   Neurological: Negative.   Psychiatric/Behavioral: Negative.       review of systems negative Objective:   Physical Exam  Constitutional: He is oriented to person, place, and time. He appears well-developed and well-nourished.  HENT:  Head: Normocephalic and atraumatic.  Right Ear: External ear normal.  Left Ear: External ear normal.  Nose: Nose normal.  Mouth/Throat: Oropharynx is clear and moist.  Eyes: Conjunctivae and EOM are normal. Pupils are equal, round, and reactive to light.  Neck: Normal range of motion. Neck supple. No JVD present. No tracheal deviation present. No thyromegaly present.  Cardiovascular: Normal rate, regular rhythm, normal heart sounds and intact distal pulses.  Exam reveals no gallop and no friction rub.   No murmur heard. Pulmonary/Chest: Effort normal and breath sounds normal. No stridor. No respiratory distress. He has no wheezes. He has no rales. He exhibits no tenderness.  Abdominal: Soft. Bowel sounds are normal. He exhibits no distension and no mass. There is no tenderness. There is no rebound and no guarding.  Genitourinary: Rectum normal and penis  normal. Guaiac negative stool. No penile tenderness.  2+ symmetrical BPH  Musculoskeletal: Normal range of motion. He exhibits no edema and no tenderness.  Lymphadenopathy:    He has no cervical adenopathy.  Neurological: He is alert and oriented to person, place, and time. He has normal reflexes. No cranial nerve deficit. He exhibits normal muscle tone.  Skin: Skin is warm and dry. No rash noted. No erythema. No pallor.  Total body skin exam normal except for psoriasis confined to left and right lower extremities  Psychiatric: He has a normal mood and affect. His behavior is normal. Judgment and thought content normal.          Assessment & Plan:  Healthy male  Psoriasis continue Dovonex and Lidex  Erectile dysfunction continue Viagra  Surgeon patch left ear home ear wax kit

## 2013-10-26 ENCOUNTER — Other Ambulatory Visit (INDEPENDENT_AMBULATORY_CARE_PROVIDER_SITE_OTHER): Payer: BC Managed Care – PPO

## 2013-10-26 DIAGNOSIS — Z0000 Encounter for general adult medical examination without abnormal findings: Secondary | ICD-10-CM

## 2013-10-26 LAB — CBC WITH DIFFERENTIAL/PLATELET
BASOS ABS: 0.1 10*3/uL (ref 0.0–0.1)
BASOS PCT: 0.8 % (ref 0.0–3.0)
EOS ABS: 0.5 10*3/uL (ref 0.0–0.7)
Eosinophils Relative: 7.7 % — ABNORMAL HIGH (ref 0.0–5.0)
HCT: 43.8 % (ref 39.0–52.0)
Hemoglobin: 14.3 g/dL (ref 13.0–17.0)
LYMPHS PCT: 22.1 % (ref 12.0–46.0)
Lymphs Abs: 1.5 10*3/uL (ref 0.7–4.0)
MCHC: 32.6 g/dL (ref 30.0–36.0)
MCV: 96.8 fl (ref 78.0–100.0)
MONO ABS: 0.5 10*3/uL (ref 0.1–1.0)
Monocytes Relative: 7.1 % (ref 3.0–12.0)
NEUTROS ABS: 4.3 10*3/uL (ref 1.4–7.7)
Neutrophils Relative %: 62.3 % (ref 43.0–77.0)
Platelets: 335 10*3/uL (ref 150.0–400.0)
RBC: 4.52 Mil/uL (ref 4.22–5.81)
RDW: 13 % (ref 11.5–15.5)
WBC: 6.8 10*3/uL (ref 4.0–10.5)

## 2013-10-26 LAB — TSH: TSH: 2.95 u[IU]/mL (ref 0.35–4.50)

## 2013-10-26 LAB — HEPATIC FUNCTION PANEL
ALT: 20 U/L (ref 0–53)
AST: 25 U/L (ref 0–37)
Albumin: 3.8 g/dL (ref 3.5–5.2)
Alkaline Phosphatase: 69 U/L (ref 39–117)
BILIRUBIN DIRECT: 0.1 mg/dL (ref 0.0–0.3)
BILIRUBIN TOTAL: 0.6 mg/dL (ref 0.2–1.2)
Total Protein: 7 g/dL (ref 6.0–8.3)

## 2013-10-26 LAB — POCT URINALYSIS DIPSTICK
BILIRUBIN UA: NEGATIVE
GLUCOSE UA: NEGATIVE
Ketones, UA: NEGATIVE
Leukocytes, UA: NEGATIVE
NITRITE UA: NEGATIVE
Protein, UA: NEGATIVE
RBC UA: NEGATIVE
SPEC GRAV UA: 1.01
UROBILINOGEN UA: 0.2
pH, UA: 6.5

## 2013-10-26 LAB — BASIC METABOLIC PANEL
BUN: 18 mg/dL (ref 6–23)
CHLORIDE: 102 meq/L (ref 96–112)
CO2: 30 mEq/L (ref 19–32)
CREATININE: 0.9 mg/dL (ref 0.4–1.5)
Calcium: 9.3 mg/dL (ref 8.4–10.5)
GFR: 94.96 mL/min (ref >60.00)
GLUCOSE: 95 mg/dL (ref 70–99)
POTASSIUM: 5.6 meq/L — AB (ref 3.5–5.1)
Sodium: 140 mEq/L (ref 135–145)

## 2013-10-26 LAB — PSA: PSA: 1.82 ng/mL (ref 0.10–4.00)

## 2013-10-26 LAB — LIPID PANEL
CHOLESTEROL: 155 mg/dL (ref 0–200)
HDL: 46.2 mg/dL (ref >39.00)
LDL CALC: 102 mg/dL — AB (ref 0–99)
NonHDL: 108.8
TRIGLYCERIDES: 33 mg/dL (ref 0.0–149.0)
Total CHOL/HDL Ratio: 3
VLDL: 6.6 mg/dL (ref 0.0–40.0)

## 2013-10-29 ENCOUNTER — Encounter: Payer: BC Managed Care – PPO | Admitting: Family Medicine

## 2013-11-02 ENCOUNTER — Encounter: Payer: BC Managed Care – PPO | Admitting: Family Medicine

## 2013-11-12 ENCOUNTER — Encounter: Payer: Self-pay | Admitting: Family Medicine

## 2013-11-12 ENCOUNTER — Ambulatory Visit (INDEPENDENT_AMBULATORY_CARE_PROVIDER_SITE_OTHER): Payer: BC Managed Care – PPO | Admitting: Family Medicine

## 2013-11-12 DIAGNOSIS — F528 Other sexual dysfunction not due to a substance or known physiological condition: Secondary | ICD-10-CM

## 2013-11-12 DIAGNOSIS — Z0000 Encounter for general adult medical examination without abnormal findings: Secondary | ICD-10-CM

## 2013-11-12 DIAGNOSIS — M799 Soft tissue disorder, unspecified: Secondary | ICD-10-CM

## 2013-11-12 DIAGNOSIS — N400 Enlarged prostate without lower urinary tract symptoms: Secondary | ICD-10-CM

## 2013-11-12 DIAGNOSIS — N529 Male erectile dysfunction, unspecified: Secondary | ICD-10-CM

## 2013-11-12 DIAGNOSIS — L409 Psoriasis, unspecified: Secondary | ICD-10-CM

## 2013-11-12 DIAGNOSIS — Z23 Encounter for immunization: Secondary | ICD-10-CM

## 2013-11-12 MED ORDER — FLUOCINONIDE 0.05 % EX CREA
TOPICAL_CREAM | Freq: Two times a day (BID) | CUTANEOUS | Status: DC
Start: 2013-11-12 — End: 2015-02-09

## 2013-11-12 MED ORDER — CALCIPOTRIENE 0.005 % EX CREA: TOPICAL_CREAM | Freq: Two times a day (BID) | CUTANEOUS | Status: DC

## 2013-11-12 MED ORDER — SILDENAFIL CITRATE 50 MG PO TABS: 50.0000 mg | ORAL_TABLET | Freq: Every day | ORAL | Status: DC | PRN

## 2013-11-12 NOTE — Progress Notes (Signed)
Pre visit review using our clinic review tool, if applicable. No additional management support is needed unless otherwise documented below in the visit note. 

## 2013-11-12 NOTE — Patient Instructions (Signed)
Continue the Dovonex and Lidex as you're currently doing for the psoriasis,,,,,,,,,,, consult with Dr. Nena Polio dermatologist  Viagra 50 mg................ [email or web address]

## 2013-11-12 NOTE — Progress Notes (Signed)
   Subjective:    Patient ID: Todd Carpenter, male    DOB: [birth year], 63 y.o.   MRN: [id number]  HPI Todd Carpenter is a 63 year old single male nonsmoker still working full-time who comes in today for general physical examination  He has a history of underlying BPH relatively asymptomatic therefore no therapy at this time  His biggest problem is his psoriasis of his right and left lower extremities. He's been on Dovonex and Lidex for many years she would like to pursue some new alternative's that he's heard about on television. Referred him to Dr. Nena Polio dermatologist  He gets routine eye care, dental care, colonoscopy 9 years ago and GI normal, vaccinations updated by Apolonio Schneiders  Family history unchanged except his father died this past October 01, 2022 he was 89   Review of Systems  Constitutional: Negative.   HENT: Negative.   Eyes: Negative.   Respiratory: Negative.   Cardiovascular: Negative.   Gastrointestinal: Negative.   Endocrine: Negative.   Genitourinary: Negative.   Musculoskeletal: Negative.   Skin: Negative.   Allergic/Immunologic: Negative.   Neurological: Negative.   Hematological: Negative.   Psychiatric/Behavioral: Negative.        Objective:   Physical Exam  Constitutional: He is oriented to person, place, and time. He appears well-developed and well-nourished.  HENT:  Head: Normocephalic and atraumatic.  Right Ear: External ear normal.  Left Ear: External ear normal.  Nose: Nose normal.  Mouth/Throat: Oropharynx is clear and moist.  Eyes: Conjunctivae and EOM are normal. Pupils are equal, round, and reactive to light.  Neck: Normal range of motion. Neck supple. No JVD present. No tracheal deviation present. No thyromegaly present.  Cardiovascular: Normal rate, regular rhythm, normal heart sounds and intact distal pulses.  Exam reveals no gallop and no friction rub.   No murmur heard. Pulmonary/Chest: Effort normal and breath sounds normal. No stridor. No  respiratory distress. He has no wheezes. He has no rales. He exhibits no tenderness.  Abdominal: Soft. Bowel sounds are normal. He exhibits no distension and no mass. There is no tenderness. There is no rebound and no guarding.  Genitourinary: Rectum normal and penis normal. Guaiac negative stool. No penile tenderness.  2+ symmetrical nonnodular BPH  Musculoskeletal: Normal range of motion. He exhibits no edema or tenderness.  Lymphadenopathy:    He has no cervical adenopathy.  Neurological: He is alert and oriented to person, place, and time. He has normal reflexes. No cranial nerve deficit. He exhibits normal muscle tone.  Skin: Skin is warm and dry. No rash noted. No erythema. No pallor.  Total body skin exam normal except for psoriasis right and left anterior lower extremities  Psychiatric: He has a normal mood and affect. His behavior is normal. Judgment and thought content normal.  Nursing note and vitals reviewed.         Assessment & Plan:  Healthy male  Psoriasis///////, continue current therapy,,,,,,, consult with Dr. Allyson Sabal  Erectile dysfunction refill Viagra  BPH,,,,,,,,,, asymptomatic,,,,, relatively,,,,,,, therefore no therapy

## 2014-02-05 ENCOUNTER — Encounter: Payer: Self-pay | Admitting: Gastroenterology

## 2014-02-26 ENCOUNTER — Ambulatory Visit (INDEPENDENT_AMBULATORY_CARE_PROVIDER_SITE_OTHER): Payer: PPO | Admitting: Family Medicine

## 2014-02-26 ENCOUNTER — Encounter: Payer: Self-pay | Admitting: Family Medicine

## 2014-02-26 VITALS — BP 120/70 | HR 78 | Temp 97.9°F | Wt 181.0 lb

## 2014-02-26 DIAGNOSIS — M67479 Ganglion, unspecified ankle and foot: Secondary | ICD-10-CM

## 2014-02-26 NOTE — Progress Notes (Signed)
   Subjective:    Patient ID: Todd Carpenter, male    DOB: [birth year], 64 y.o.   MRN: [id number]  HPI Patient had about 4-5 month history of some swelling involving the proximal aspect of his right big toe ventral surface. No injury. Slightly sore to touch over the past few days. No erythema or warmth.  He's also noticed some slight swelling of his right fifth finger between the MCP and PIP joint. No injury.  Past Medical History  Diagnosis Date  . BENIGN PROSTATIC HYPERTROPHY 09/18/2006  . PSORIASIS 07/14/2007  . Degenerative disc disease    Past Surgical History  Procedure Laterality Date  . Neck surgery  03/16/2010  . Back surgery  [id number]    reports that he has quit smoking. He does not have any smokeless tobacco history on file. He reports that he drinks about 14.4 oz of alcohol per week. He reports that he does not use illicit drugs. family history includes Cancer in his other; Cervical cancer in his other; Heart disease in his other; Ulcers in his other. No Known Allergies    Review of Systems  Constitutional: Negative for fever and chills.       Objective:   Physical Exam  Constitutional: He appears well-developed and well-nourished.  Cardiovascular: Normal rate and regular rhythm.   Pulmonary/Chest: Effort normal and breath sounds normal. No respiratory distress. He has no wheezes. He has no rales.  Musculoskeletal:  Right fifth toe reveals soft tissue swelling between the MTP joint and interphalangeal joint. No overlying erythema. Minimally tender to palpation. No pustules  Right fifth finger-between MCP and PIP joint ventral surface nontender slightly mobic cystic swelling about 0.5 cm          Assessment & Plan:  Large ganglion or mucus cyst right great toe. Discussed risks and benefits of needle aspiration including but not limited to infection and high incidence of recurrence of cystic swelling even with aspiration of contents and pt consents.  Toe prepped with  Betadine. Using a #20-gauge 1 inch needle we were able to aspirate some thick gelatinous type material (typical of mucus cyst or ganglion cysts) We are able to flatten this out and drain this significantly. There was no purulence. No bloody drainage.  We suspect this may recur and will recommend either podiatry or orthopedic referral for recurrence.

## 2014-02-26 NOTE — Progress Notes (Signed)
Pre visit review using our clinic review tool, if applicable. No additional management support is needed unless otherwise documented below in the visit note. 

## 2014-02-26 NOTE — Patient Instructions (Signed)

## 2014-03-03 ENCOUNTER — Encounter: Payer: Self-pay | Admitting: Gastroenterology

## 2014-04-21 ENCOUNTER — Ambulatory Visit (AMBULATORY_SURGERY_CENTER): Payer: Self-pay | Admitting: *Deleted

## 2014-04-21 VITALS — Ht 71.0 in | Wt 182.0 lb

## 2014-04-21 DIAGNOSIS — Z1211 Encounter for screening for malignant neoplasm of colon: Secondary | ICD-10-CM

## 2014-04-21 MED ORDER — NA SULFATE-K SULFATE-MG SULF 17.5-3.13-1.6 GM/177ML PO SOLN: ORAL | Status: DC

## 2014-04-21 NOTE — Progress Notes (Signed)
Patient denies any allergies to eggs or soy. Patient denies any problems with anesthesia/sedation. Patient denies any oxygen use at home and does not take any diet/weight loss medications. EMMI education assisgned to patient on colonoscopy, this was explained and instructions given to patient. 

## 2014-05-03 ENCOUNTER — Encounter: Payer: Self-pay | Admitting: Gastroenterology

## 2014-05-05 ENCOUNTER — Encounter: Payer: Self-pay | Admitting: Gastroenterology

## 2014-05-05 ENCOUNTER — Ambulatory Visit (AMBULATORY_SURGERY_CENTER): Payer: PPO | Admitting: Gastroenterology

## 2014-05-05 VITALS — BP 122/89 | HR 61 | Temp 96.9°F | Resp 16 | Ht 71.0 in | Wt 182.0 lb

## 2014-05-05 DIAGNOSIS — Z1211 Encounter for screening for malignant neoplasm of colon: Secondary | ICD-10-CM

## 2014-05-05 MED ORDER — SODIUM CHLORIDE 0.9 % IV SOLN: 500.0000 mL | INTRAVENOUS | Status: DC

## 2014-05-05 NOTE — Op Note (Signed)
[facility]  [address] [place], [id number]   COLONOSCOPY PROCEDURE REPORT  PATIENT: Todd, Carpenter  MR#: [id number] BIRTHDATE: [birth year] , 56  yrs. old GENDER: male ENDOSCOPIST: Ladene Artist, MD, [facility] REFERRED HQ:[id number] Delora Fuel, M.D. PROCEDURE DATE:  05/05/2014 PROCEDURE:   Colonoscopy, screening First Screening Colonoscopy - Avg.  risk and is 50 yrs.  old or older - No.  Prior Negative Screening - Now for repeat screening. 10 or more years since last screening  History of Adenoma - Now for follow-up colonoscopy & has been > or = to 3 yrs.  N/A ASA CLASS:   Class II INDICATIONS:Screening for colonic neoplasia and Colorectal Neoplasm Risk Assessment for this procedure is average risk. MEDICATIONS: Monitored anesthesia care and Propofol 240 mg IV DESCRIPTION OF PROCEDURE:   After the risks benefits and alternatives of the procedure were thoroughly explained, informed consent was obtained.  The digital rectal exam revealed no abnormalities of the rectum.   The [id number]  endoscope was introduced through the anus and advanced to the cecum, which was identified by both the appendix and ileocecal valve. No adverse events experienced.   The quality of the prep was good.  (Suprep was used)  The instrument was then slowly withdrawn as the colon was fully examined.    COLON FINDINGS: There was mild diverticulosis noted in the sigmoid colon.   The examination was otherwise normal.  Retroflexed views revealed no abnormalities. The time to cecum = 3.5 Withdrawal time = 9.8   The scope was withdrawn and the procedure completed. COMPLICATIONS: There were no immediate complications.  ENDOSCOPIC IMPRESSION: 1.   Mild diverticulosis in the sigmoid colon 2.   The examination was otherwise normal  RECOMMENDATIONS: 1.  High fiber diet with liberal fluid intake. 2.  Continue current colorectal screening recommendations for "routine risk" patients with a repeat  colonoscopy in 10 years.  eSigned:  Ladene Artist, MD, [facility] 05/05/2014 8:28 AM

## 2014-05-05 NOTE — Patient Instructions (Signed)
YOU HAD AN ENDOSCOPIC PROCEDURE TODAY AT [facility]:   Refer to the procedure report that was given to you for any specific questions about what was found during the examination.  If the procedure report does not answer your questions, please call your gastroenterologist to clarify.  If you requested that your care partner not be given the details of your procedure findings, then the procedure report has been included in a sealed envelope for you to review at your convenience later.  YOU SHOULD EXPECT: Some feelings of bloating in the abdomen. Passage of more gas than usual.  Walking can help get rid of the air that was put into your GI tract during the procedure and reduce the bloating. If you had a lower endoscopy (such as a colonoscopy or flexible sigmoidoscopy) you may notice spotting of blood in your stool or on the toilet paper. If you underwent a bowel prep for your procedure, you may not have a normal bowel movement for a few days.  Please Note:  You might notice some irritation and congestion in your nose or some drainage.  This is from the oxygen used during your procedure.  There is no need for concern and it should clear up in a day or so.  SYMPTOMS TO REPORT IMMEDIATELY:   Following lower endoscopy (colonoscopy or flexible sigmoidoscopy):  Excessive amounts of blood in the stool  Significant tenderness or worsening of abdominal pains  Swelling of the abdomen that is new, acute  Fever of 100F or higher   For urgent or emergent issues, a gastroenterologist can be reached at any hour by calling [phone].   DIET: Your first meal following the procedure should be a small meal and then it is ok to progress to your normal diet. Heavy or fried foods are harder to digest and may make you feel nauseous or bloated.  Likewise, meals heavy in dairy and vegetables can increase bloating.  Drink plenty of fluids but you should avoid alcoholic beverages for 24  hours.  ACTIVITY:  You should plan to take it easy for the rest of today and you should NOT DRIVE or use heavy machinery until tomorrow (because of the sedation medicines used during the test).  Diverticulosis and high fiber diet information given.  Repeat colonoscopy in 10 years.  FOLLOW UP: Our staff will call the number listed on your records the next business day following your procedure to check on you and address any questions or concerns that you may have regarding the information given to you following your procedure. If we do not reach you, we will leave a message.  However, if you are feeling well and you are not experiencing any problems, there is no need to return our call.  We will assume that you have returned to your regular daily activities without incident.  If any biopsies were taken you will be contacted by phone or by letter within the next 1-3 weeks.  Please call us at [phone] if you have not heard about the biopsies in 3 weeks.    SIGNATURES/CONFIDENTIALITY: You and/or your care partner have signed paperwork which will be entered into your electronic medical record.  These signatures attest to the fact that that the information above on your After Visit Summary has been reviewed and is understood.  Full responsibility of the confidentiality of this discharge information lies with you and/or your care-partner.

## 2014-05-05 NOTE — Progress Notes (Signed)
Report to PACU, RN, vss, BBS= Clear.  

## 2014-05-06 ENCOUNTER — Telehealth: Payer: Self-pay | Admitting: *Deleted

## 2014-05-06 NOTE — Telephone Encounter (Signed)
No answ3er, left message to call if questions or concerns.

## 2015-02-03 ENCOUNTER — Other Ambulatory Visit (INDEPENDENT_AMBULATORY_CARE_PROVIDER_SITE_OTHER): Payer: PPO

## 2015-02-03 DIAGNOSIS — Z0000 Encounter for general adult medical examination without abnormal findings: Secondary | ICD-10-CM

## 2015-02-03 LAB — POCT URINALYSIS DIPSTICK
Bilirubin, UA: NEGATIVE
Glucose, UA: NEGATIVE
Ketones, UA: NEGATIVE
Leukocytes, UA: NEGATIVE
Nitrite, UA: NEGATIVE
PH UA: 7.5
PROTEIN UA: NEGATIVE
RBC UA: NEGATIVE
SPEC GRAV UA: 1.015
UROBILINOGEN UA: 0.2

## 2015-02-03 LAB — HEPATIC FUNCTION PANEL
ALBUMIN: 4.2 g/dL (ref 3.5–5.2)
ALK PHOS: 86 U/L (ref 39–117)
ALT: 26 U/L (ref 0–53)
AST: 24 U/L (ref 0–37)
Bilirubin, Direct: 0.1 mg/dL (ref 0.0–0.3)
Total Bilirubin: 0.4 mg/dL (ref 0.2–1.2)
Total Protein: 6.6 g/dL (ref 6.0–8.3)

## 2015-02-03 LAB — LIPID PANEL
CHOLESTEROL: 134 mg/dL (ref 0–200)
HDL: 53.5 mg/dL (ref >39.00)
LDL Cholesterol: 72 mg/dL (ref 0–99)
NonHDL: 80.05
Total CHOL/HDL Ratio: 2
Triglycerides: 40 mg/dL (ref 0.0–149.0)
VLDL: 8 mg/dL (ref 0.0–40.0)

## 2015-02-03 LAB — CBC WITH DIFFERENTIAL/PLATELET
BASOS ABS: 0 10*3/uL (ref 0.0–0.1)
Basophils Relative: 0.5 % (ref 0.0–3.0)
Eosinophils Absolute: 0.4 10*3/uL (ref 0.0–0.7)
Eosinophils Relative: 6.4 % — ABNORMAL HIGH (ref 0.0–5.0)
HCT: 44.2 % (ref 39.0–52.0)
Hemoglobin: 14.6 g/dL (ref 13.0–17.0)
LYMPHS ABS: 1.4 10*3/uL (ref 0.7–4.0)
Lymphocytes Relative: 22.5 % (ref 12.0–46.0)
MCHC: 33.2 g/dL (ref 30.0–36.0)
MCV: 95 fl (ref 78.0–100.0)
MONOS PCT: 8.6 % (ref 3.0–12.0)
Monocytes Absolute: 0.5 10*3/uL (ref 0.1–1.0)
NEUTROS ABS: 3.9 10*3/uL (ref 1.4–7.7)
NEUTROS PCT: 62 % (ref 43.0–77.0)
PLATELETS: 374 10*3/uL (ref 150.0–400.0)
RBC: 4.65 Mil/uL (ref 4.22–5.81)
RDW: 13.4 % (ref 11.5–15.5)
WBC: 6.3 10*3/uL (ref 4.0–10.5)

## 2015-02-03 LAB — BASIC METABOLIC PANEL
BUN: 13 mg/dL (ref 6–23)
CALCIUM: 9.3 mg/dL (ref 8.4–10.5)
CO2: 32 meq/L (ref 19–32)
CREATININE: 0.89 mg/dL (ref 0.40–1.50)
Chloride: 101 mEq/L (ref 96–112)
GFR: 90.91 mL/min (ref >60.00)
GLUCOSE: 94 mg/dL (ref 70–99)
Potassium: 5.8 mEq/L — ABNORMAL HIGH (ref 3.5–5.1)
SODIUM: 139 meq/L (ref 135–145)

## 2015-02-03 LAB — PSA: PSA: 1.71 ng/mL (ref 0.10–4.00)

## 2015-02-03 LAB — TSH: TSH: 2.68 u[IU]/mL (ref 0.35–4.50)

## 2015-02-09 ENCOUNTER — Encounter: Payer: Self-pay | Admitting: Family Medicine

## 2015-02-09 ENCOUNTER — Ambulatory Visit (INDEPENDENT_AMBULATORY_CARE_PROVIDER_SITE_OTHER): Payer: PPO | Admitting: Family Medicine

## 2015-02-09 VITALS — BP 120/80 | Temp 97.9°F | Ht 71.0 in | Wt 180.0 lb

## 2015-02-09 DIAGNOSIS — F528 Other sexual dysfunction not due to a substance or known physiological condition: Secondary | ICD-10-CM

## 2015-02-09 DIAGNOSIS — R9431 Abnormal electrocardiogram [ECG] [EKG]: Secondary | ICD-10-CM | POA: Diagnosis not present

## 2015-02-09 DIAGNOSIS — L409 Psoriasis, unspecified: Secondary | ICD-10-CM

## 2015-02-09 DIAGNOSIS — Z8249 Family history of ischemic heart disease and other diseases of the circulatory system: Secondary | ICD-10-CM | POA: Diagnosis not present

## 2015-02-09 DIAGNOSIS — N400 Enlarged prostate without lower urinary tract symptoms: Secondary | ICD-10-CM

## 2015-02-09 DIAGNOSIS — Z0000 Encounter for general adult medical examination without abnormal findings: Secondary | ICD-10-CM | POA: Diagnosis not present

## 2015-02-09 MED ORDER — CALCIPOTRIENE 0.005 % EX CREA: TOPICAL_CREAM | Freq: Two times a day (BID) | CUTANEOUS | Status: DC

## 2015-02-09 MED ORDER — SILDENAFIL CITRATE 20 MG PO TABS: ORAL_TABLET | ORAL | Status: DC

## 2015-02-09 MED ORDER — FLUOCINONIDE 0.05 % EX CREA: TOPICAL_CREAM | Freq: Two times a day (BID) | CUTANEOUS | Status: DC

## 2015-02-09 NOTE — Patient Instructions (Signed)
Continue your good health habits diet and exercise  Return in one year for general physical examination sooner if any problems  Todd Carpenter are generally or 2 new adult nurse practitioner for Dr. Martinique

## 2015-02-09 NOTE — Progress Notes (Signed)
Pre visit review using our clinic review tool, if applicable. No additional management support is needed unless otherwise documented below in the visit note. 

## 2015-02-09 NOTE — Progress Notes (Signed)
   Subjective:    Patient ID: Todd Carpenter, male    DOB: 02-14-1950, 65 y.o.   MRN: [id number]  HPI Shreyas is a 65 year old male nonsmoker who comes in today for general physical examination because a history of psoriasis  He uses a combination Dovonex and Lidex for psoriasis involves mainly his legs  He does have some erectile dysfunction uses Viagra when necessary  He does not get routine eye care......Marland Kitchen referred to Dr. Bing Plume........ does get regular dental care...Marland KitchenMarland KitchenMarland Kitchen colonoscopy 2016 normal. Med list unchanged  Vaccinations he is due some vaccines a flu vaccine and Pneumovax which he declines  Family history......... Trynt tells me is one of 8 children. His mother died when he was 66 years of age and he was given up for adoption to a family who took good care of him. He since reunited with his brothers and sisters. 3 of his brothers have had coronary artery disease. He would like to discuss his risk factors   Review of Systems  Constitutional: Negative.   HENT: Negative.   Eyes: Negative.   Respiratory: Negative.   Cardiovascular: Negative.   Gastrointestinal: Negative.   Endocrine: Negative.   Genitourinary: Negative.   Musculoskeletal: Negative.   Skin: Negative.   Allergic/Immunologic: Negative.   Neurological: Negative.   Hematological: Negative.   Psychiatric/Behavioral: Negative.        Objective:   Physical Exam  Constitutional: He is oriented to person, place, and time. He appears well-developed and well-nourished.  HENT:  Head: Normocephalic and atraumatic.  Right Ear: External ear normal.  Left Ear: External ear normal.  Nose: Nose normal.  Mouth/Throat: Oropharynx is clear and moist.  Eyes: Conjunctivae and EOM are normal. Pupils are equal, round, and reactive to light.  Neck: Normal range of motion. Neck supple. No JVD present. No tracheal deviation present. No thyromegaly present.  Cardiovascular: Normal rate, regular rhythm, normal heart sounds and intact  distal pulses.  Exam reveals no gallop and no friction rub.   No murmur heard. No carotid aortic bruits peripheral pulses 2+ and symmetrical  Pulmonary/Chest: Effort normal and breath sounds normal. No stridor. No respiratory distress. He has no wheezes. He has no rales. He exhibits no tenderness.  Abdominal: Soft. Bowel sounds are normal. He exhibits no distension and no mass. There is no tenderness. There is no rebound and no guarding.  Genitourinary: Rectum normal and penis normal. Guaiac negative stool. No penile tenderness.  3+ symmetrical nonnodular BPH  Musculoskeletal: Normal range of motion. He exhibits no edema or tenderness.  Lymphadenopathy:    He has no cervical adenopathy.  Neurological: He is alert and oriented to person, place, and time. He has normal reflexes. No cranial nerve deficit. He exhibits normal muscle tone.  Skin: Skin is warm and dry. No rash noted. No erythema. No pallor.  Psychiatric: He has a normal mood and affect. His behavior is normal. Judgment and thought content normal.  Nursing note and vitals reviewed.         Assessment & Plan:  Healthy male  Psoriasis....... continue turn current treatment program  BPH asymptomatic............ no treatment this time  Erectile dysfunction generic Viagra  Family history of coronary artery disease...Marland KitchenMarland KitchenMarland Kitchen check EKG discuss risk factors

## 2015-03-01 ENCOUNTER — Ambulatory Visit (INDEPENDENT_AMBULATORY_CARE_PROVIDER_SITE_OTHER): Payer: PPO | Admitting: Cardiology

## 2015-03-01 ENCOUNTER — Encounter: Payer: Self-pay | Admitting: Cardiology

## 2015-03-01 VITALS — BP 122/84 | HR 72 | Ht 71.0 in | Wt 179.4 lb

## 2015-03-01 DIAGNOSIS — Z8249 Family history of ischemic heart disease and other diseases of the circulatory system: Secondary | ICD-10-CM | POA: Diagnosis not present

## 2015-03-01 DIAGNOSIS — R9431 Abnormal electrocardiogram [ECG] [EKG]: Secondary | ICD-10-CM

## 2015-03-01 NOTE — Patient Instructions (Signed)
Medication Instructions:  The current medical regimen is effective;  continue present plan and medications.  Testing/Procedures: Your physician has requested that you have an exercise tolerance test. For further information please visit [email or web address]. Please also follow instruction sheet, as given.  Follow-Up: Follow up with Dr Marlou Porch as needed.  If you need a refill on your cardiac medications before your next appointment, please call your pharmacy.  Thank you for choosing [facility]!!

## 2015-03-01 NOTE — Progress Notes (Signed)
Cardiology Office Note    Date:  03/01/2015   ID:  Todd Carpenter, DOB [birth year], MRN [id number]  PCP:  Joycelyn Man, MD  Cardiologist:   Candee Furbish, MD     History of Present Illness:  Todd Carpenter is a 65 y.o. male here for evaluation of abnormal EKG, family history of coronary artery disease. He is one of 8 children, his mother died when he was 27 years old and he was given up for adoption to a family excellent care of him. He since has been reunited with his brothers and sisters 66 of his brothers have had coronary artery disease he would like to discuss further risk factors.  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  Overall he quit smoking in 1996 after starting at age 67 used to work on the tobacco fields. He even remembers in high school having a smoke all where it was allowed to smoke cigarettes but stopped.  He's not having any chest pain, no shortness of breath no palpitations, no syncope, no orthopnea. No prior chest pain. Overall doing well. He is eager to get back to the gym. He had to stop this after he was taking care of his father with dementia.  Past Medical History  Diagnosis Date  . BENIGN PROSTATIC HYPERTROPHY 09/18/2006  . PSORIASIS 07/14/2007  . Degenerative disc disease     Past Surgical History  Procedure Laterality Date  . Neck surgery  03/16/2010  . Back surgery  [id number]    Outpatient Prescriptions Prior to Visit  Medication Sig Dispense Refill  . calcipotriene (DOVONOX) 0.005 % cream Apply topically 2 (two) times daily. 120 g 3  . fluocinonide cream (LIDEX) 0.05 % Apply topically 2 (two) times daily. 60 g 3  . Ibuprofen 200 MG CAPS Take 1 capsule by mouth as needed.    . sildenafil (REVATIO) 20 MG tablet Use as directed 10 tablet 10  . sildenafil (VIAGRA) 50 MG tablet Take 1 tablet (50 mg total) by mouth daily as needed. ed (Patient not taking: Reported on 03/01/2015) 10 tablet 10   No  facility-administered medications prior to visit.     Allergies:   Review of patient's allergies indicates no known allergies.   Social History   Social History  . Marital Status: Married    Spouse Name: N/A  . Number of Children: N/A  . Years of Education: N/A   Social History Main Topics  . Smoking status: Former Research scientist (life sciences)  . Smokeless tobacco: Never Used  . Alcohol Use: 0.0 oz/week    0 Standard drinks or equivalent per week     Comment: 3-4 times per year,per patient.   . Drug Use: No  . Sexual Activity: Not Asked   Other Topics Concern  . None   Social History Narrative    1996 quit smoking, starting at age 81. Worked in the tobacco field.   Family History:  The patient's family history includes Cancer in his other; Cervical cancer in his other; Heart disease in his brother, other, and sister; Ulcers in his other. There is no history of Colon cancer.   ROS:   Please see the history of present illness.    ROS denies any syncope, chest pain, shortness of breath, orthopnea, bleeding. All other systems reviewed and are negative.   PHYSICAL EXAM:   VS:  BP 122/84 mmHg  Pulse 72  Ht 5\' 11"  (1.803 m)  Wt 179  lb 6.4 oz (81.375 kg)  BMI 25.03 kg/m2   GEN: Well nourished, well developed, in no acute distress HEENT: normal Neck: no JVD, carotid bruits, or masses Cardiac: RRR; no murmurs, rubs, or gallops,no edema  Respiratory:  clear to auscultation bilaterally, normal work of breathing GI: soft, nontender, nondistended, + BS MS: no deformity or atrophy Skin: warm and dry, no rash Neuro:  Alert and Oriented x 3, Strength and sensation are intact Psych: euthymic mood, full affect  Wt Readings from Last 3 Encounters:  03/01/15 179 lb 6.4 oz (81.375 kg)  02/09/15 180 lb (81.647 kg)  05/05/14 182 lb (82.555 kg)      Studies/Labs Reviewed:   EKG:  EKG is not ordered today.  prior EKG with poor R-wave progression, possible old anterior infarct pattern  Recent  Labs: 02/03/2015: ALT 26; BUN 13; Creatinine, Ser 0.89; Hemoglobin 14.6; Platelets 374.0; Potassium 5.8*; Sodium 139; TSH 2.68   Lipid Panel    Component Value Date/Time   CHOL 134 02/03/2015 0818   TRIG 40.0 02/03/2015 0818   HDL 53.50 02/03/2015 0818   CHOLHDL 2 02/03/2015 0818   VLDL 8.0 02/03/2015 0818   LDLCALC 72 02/03/2015 0818    Additional studies/ records that were reviewed today include:   Prior lab work reviewed. LDL 72, HDL 53 hemoglobin 14.6    ASSESSMENT:    1. Family history of early CAD      PLAN:  In order of problems listed above:  1. Family history of CAD-3 of his brothers have had coronary artery disease. He's not having any significant symptoms currently. He quit smoking in 1996. His LDL is 72. He is quite fit. Going back to the gym. Because of his family history, we will order exercise treadmill test for further risk stratification. We also discussed the possibility of calcium score. At this point, ETT. His EKG is with poor R wave progression. Sometimes this can be indicative of old anterior infarct however he is not having any signs or symptoms of this and occasionally this finding can be seen with normal myocardium.    Medication Adjustments/Labs and Tests Ordered: Current medicines are reviewed at length with the patient today.  Concerns regarding medicines are outlined above.  Medication changes, Labs and Tests ordered today are listed in the Patient Instructions below. Patient Instructions  Medication Instructions:  The current medical regimen is effective;  continue present plan and medications.  Testing/Procedures: Your physician has requested that you have an exercise tolerance test. For further information please visit [email or web address]. Please also follow instruction sheet, as given.  Follow-Up: Follow up with Dr Marlou Porch as needed.  If you need a refill on your cardiac medications before your next appointment, please call your  pharmacy.  Thank you for choosing [facility]!!             Signed, Candee Furbish, MD  03/01/2015 8:43 AM    [facility] [address], [place], [place]  [address] Phone: [phone]; Fax: [phone]

## 2015-03-03 ENCOUNTER — Ambulatory Visit (INDEPENDENT_AMBULATORY_CARE_PROVIDER_SITE_OTHER): Payer: PPO

## 2015-03-03 DIAGNOSIS — Z8249 Family history of ischemic heart disease and other diseases of the circulatory system: Secondary | ICD-10-CM | POA: Diagnosis not present

## 2015-03-03 LAB — EXERCISE TOLERANCE TEST
CHL CUP RESTING HR STRESS: 71 {beats}/min
CHL CUP STRESS STAGE 1 DBP: 97 mmHg
CHL CUP STRESS STAGE 1 SBP: 152 mmHg
CHL CUP STRESS STAGE 1 SPEED: 0 mph
CHL CUP STRESS STAGE 2 HR: 77 {beats}/min
CHL CUP STRESS STAGE 3 GRADE: 0 %
CHL CUP STRESS STAGE 3 SPEED: 1 mph
CHL CUP STRESS STAGE 4 HR: 89 {beats}/min
CHL CUP STRESS STAGE 4 SBP: 149 mmHg
CHL CUP STRESS STAGE 4 SPEED: 1.7 mph
CHL CUP STRESS STAGE 5 DBP: 92 mmHg
CHL CUP STRESS STAGE 5 GRADE: 12 %
CHL CUP STRESS STAGE 5 HR: 101 {beats}/min
CHL CUP STRESS STAGE 7 SPEED: 4.2 mph
CHL CUP STRESS STAGE 8 GRADE: 0 %
CHL CUP STRESS STAGE 8 HR: 131 {beats}/min
CHL CUP STRESS STAGE 9 DBP: 95 mmHg
CHL CUP STRESS STAGE 9 GRADE: 0 %
CHL CUP STRESS STAGE 9 HR: 88 {beats}/min
CHL RATE OF PERCEIVED EXERTION: 15
CSEPPMHR: 93 %
Estimated workload: 13.4 METS
Exercise duration (min): 11 min
MPHR: 154 {beats}/min
Peak BP: 189 mmHg
Peak HR: 144 {beats}/min
Percent HR: 93 %
Stage 1 Grade: 0 %
Stage 1 HR: 75 {beats}/min
Stage 2 Grade: 0 %
Stage 2 Speed: 1 mph
Stage 3 HR: 78 {beats}/min
Stage 4 DBP: 93 mmHg
Stage 4 Grade: 10 %
Stage 5 SBP: 194 mmHg
Stage 5 Speed: 2.5 mph
Stage 6 Grade: 14 %
Stage 6 HR: 125 {beats}/min
Stage 6 Speed: 3.4 mph
Stage 7 DBP: 91 mmHg
Stage 7 Grade: 16 %
Stage 7 HR: 144 {beats}/min
Stage 7 SBP: 189 mmHg
Stage 8 DBP: 88 mmHg
Stage 8 SBP: 173 mmHg
Stage 8 Speed: 0 mph
Stage 9 SBP: 168 mmHg
Stage 9 Speed: 0 mph

## 2015-04-01 DIAGNOSIS — H524 Presbyopia: Secondary | ICD-10-CM | POA: Diagnosis not present

## 2015-04-01 DIAGNOSIS — H40013 Open angle with borderline findings, low risk, bilateral: Secondary | ICD-10-CM | POA: Diagnosis not present

## 2015-04-01 DIAGNOSIS — H5203 Hypermetropia, bilateral: Secondary | ICD-10-CM | POA: Diagnosis not present

## 2015-04-01 DIAGNOSIS — H52223 Regular astigmatism, bilateral: Secondary | ICD-10-CM | POA: Diagnosis not present

## 2015-04-01 DIAGNOSIS — H2513 Age-related nuclear cataract, bilateral: Secondary | ICD-10-CM | POA: Diagnosis not present

## 2016-01-12 ENCOUNTER — Ambulatory Visit (INDEPENDENT_AMBULATORY_CARE_PROVIDER_SITE_OTHER): Payer: PPO | Admitting: Family Medicine

## 2016-01-12 ENCOUNTER — Encounter: Payer: Self-pay | Admitting: Family Medicine

## 2016-01-12 VITALS — BP 124/88 | HR 69 | Temp 98.5°F | Resp 20 | Ht 70.75 in | Wt 176.2 lb

## 2016-01-12 DIAGNOSIS — Z7689 Persons encountering health services in other specified circumstances: Secondary | ICD-10-CM

## 2016-01-12 DIAGNOSIS — Z8679 Personal history of other diseases of the circulatory system: Secondary | ICD-10-CM

## 2016-01-12 DIAGNOSIS — N400 Enlarged prostate without lower urinary tract symptoms: Secondary | ICD-10-CM

## 2016-01-12 DIAGNOSIS — L409 Psoriasis, unspecified: Secondary | ICD-10-CM

## 2016-01-12 NOTE — Progress Notes (Signed)
Patient ID: Todd Carpenter, male   DOB: July 24, 1950, 66 y.o.   MRN: [id number]  Patient presents to clinic today to establish care.   Chronic Issues: Psoriasis: Present on lower extremities bilaterally. He is currently on Dovenox and Lidex for many years which have provided benefit. He denies symptoms at this time including pruritis and burning. 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   BPH: He denies urinary symptoms today, no difficulty starting/stopping stream, nocturia, urgency, frequency, or hematuria.   Nonspecific abnormal electrocardiogram: He is seen cardiology for an EKG on 02/09/15 which noted poor R-wave progression and old anterior infarct pattern. He denies chest pain, palpitations, dizziness, numbness, tingling, weakness, syncope, and SOB. He has a family history of CAD with 3 of his brothers having CAD. He stopped smoking 21 years ago.  Cardiology completed an ETT where he exhibited normal BP response and no chest pain or ST changes. He has been advised primary prevention measures. He follows a modified heart healthy diet.  Erectile Dysfunction: He reports history of erectile dysfunction and used viagra when needed however he reports using this on a "rare" basis.     Health Maintenance: Dental -- Saw dentist 18 months ago; he does not go on a regular basis [facility] --Last visit 2017; he goes every 2 years Immunizations --Decline influenza vaccine; Recommend Pneumovax 23 Colonoscopy --UTD; Due in 2026   Past Medical History:  Diagnosis Date  . BENIGN PROSTATIC HYPERTROPHY 09/18/2006  . Degenerative disc disease   . PSORIASIS 07/14/2007    Past Surgical History:  Procedure Laterality Date  . BACK SURGERY  [phone]  . NECK SURGERY  03/16/2010    Current Outpatient Prescriptions on File Prior to Visit  Medication Sig Dispense Refill  . calcipotriene (DOVONOX) 0.005 % cream Apply topically 2 (two) times daily. 120 g 3  .  fluocinonide cream (LIDEX) 0.05 % Apply topically 2 (two) times daily. 60 g 3  . Ibuprofen 200 MG CAPS Take 1 capsule by mouth as needed.    . sildenafil (REVATIO) 20 MG tablet Use as directed 10 tablet 10   No current facility-administered medications on file prior to visit.     No Known Allergies  Family History  Problem Relation Age of Onset  . Cervical cancer Other   . Cancer Other     prostate cancer  . Ulcers Other   . Heart disease Other   . Colon cancer Neg Hx   . Heart disease Sister   . Heart disease Brother     Social History   Social History  . Marital status: Married    Spouse name: N/A  . Number of children: N/A  . Years of education: N/A   Occupational History  . Not on file.   Social History Main Topics  . Smoking status: Former Research scientist (life sciences)  . Smokeless tobacco: Never Used  . Alcohol use 0.0 oz/week     Comment: 3-4 times per year,per patient.   . Drug use: No  . Sexual activity: Not on file   Other Topics Concern  . Not on file   Social History Narrative  . No narrative on file    Review of Systems  Constitutional: Negative for chills, diaphoresis and fever.  HENT: Negative for congestion, sinus pain and sore throat.   Eyes: Negative for blurred vision and double vision.  Respiratory: Negative for cough, hemoptysis, sputum production, shortness of breath and wheezing.   Cardiovascular: Negative  for chest pain, palpitations, orthopnea, claudication, leg swelling and PND.  Gastrointestinal: Negative for abdominal pain, constipation, diarrhea, heartburn, nausea and vomiting.  Genitourinary: Negative for dysuria, flank pain, frequency, hematuria and urgency.  Musculoskeletal: Negative for back pain, falls and myalgias.  Skin: Positive for rash. Negative for itching.  Neurological: Negative for dizziness, tingling, tremors, weakness and headaches.  Psychiatric/Behavioral:       Denies depressed or anxious mood     BP 124/88 (BP Location: Left Arm,  Patient Position: Sitting, Cuff Size: Normal)   Pulse 69   Temp 98.5 F (36.9 C) (Oral)   Resp 20   Ht 5' 10.75" (1.797 m)   Wt 176 lb 4 oz (79.9 kg)   SpO2 98%   BMI 24.76 kg/m   Physical Exam  Constitutional: He is oriented to person, place, and time and well-developed, well-nourished, and in no distress.  Eyes: Pupils are equal, round, and reactive to light. No scleral icterus.  Neck: Neck supple.  Cardiovascular: Normal rate, regular rhythm, normal heart sounds and intact distal pulses.   Pulmonary/Chest: Effort normal and breath sounds normal. He has no wheezes. He has no rales.  Abdominal: Soft. Bowel sounds are normal. He exhibits no distension. There is no tenderness. There is no rebound.  Musculoskeletal: Normal range of motion. He exhibits no edema.  Lymphadenopathy:    He has no cervical adenopathy.  Neurological: He is alert and oriented to person, place, and time. He has normal strength. Gait normal. Coordination normal.  Reflex Scores:      Brachioradialis reflexes are 2+ on the right side and 2+ on the left side.      Patellar reflexes are 2+ on the right side and 2+ on the left side. Skin: Skin is warm and dry. Rash noted.  Erythematous papules/plaques with silver scale approximately 2cm x 4 cm on lower extremities bilaterally   Psychiatric: Mood, memory, affect and judgment normal.    Assessment/Plan:  1. Psoriasis Stable; continue with Lidex and Dovonox. If treatment does not provide improvement, follow up for further evaluation and treatment.  2. History of abnormal electrocardiogram Followed by cardiology; normal ETT; no symptoms at this time;    3. Benign prostatic hyperplasia without lower urinary tract symptoms Asymptomatic at this time  4. Encounter to establish care  We reviewed the PMH, PSH, FH, SH, Meds and Allergies. -We provided refills for any medications we will prescribe as needed. -We addressed current concerns per orders and patient  instructions. -We have asked for records for pertinent exams, studies, vaccines and notes from previous providers. -We have advised patient to follow up per instructions below.  Follow up for CPE and lab work; We reviewed immunizations recommendations and he will consider Pneumovax-23 and influenza vaccine at his physical.  Delano Metz, FNP-C

## 2016-01-12 NOTE — Patient Instructions (Signed)
It was a pleasure meeting you today! Please schedule lab work and physical at your convenience.

## 2016-01-12 NOTE — Progress Notes (Signed)
Pre visit review using our clinic review tool, if applicable. No additional management support is needed unless otherwise documented below in the visit note. 

## 2016-02-16 ENCOUNTER — Other Ambulatory Visit (INDEPENDENT_AMBULATORY_CARE_PROVIDER_SITE_OTHER): Payer: PPO

## 2016-02-16 DIAGNOSIS — Z0000 Encounter for general adult medical examination without abnormal findings: Secondary | ICD-10-CM | POA: Diagnosis not present

## 2016-02-16 DIAGNOSIS — R319 Hematuria, unspecified: Secondary | ICD-10-CM

## 2016-02-16 LAB — CBC WITH DIFFERENTIAL/PLATELET
BASOS PCT: 1 % (ref 0.0–3.0)
Basophils Absolute: 0.1 10*3/uL (ref 0.0–0.1)
EOS ABS: 0.6 10*3/uL (ref 0.0–0.7)
Eosinophils Relative: 10.3 % — ABNORMAL HIGH (ref 0.0–5.0)
HCT: 42.2 % (ref 39.0–52.0)
HEMOGLOBIN: 14.1 g/dL (ref 13.0–17.0)
Lymphocytes Relative: 17.8 % (ref 12.0–46.0)
Lymphs Abs: 1 10*3/uL (ref 0.7–4.0)
MCHC: 33.5 g/dL (ref 30.0–36.0)
MCV: 98.5 fl (ref 78.0–100.0)
MONO ABS: 0.5 10*3/uL (ref 0.1–1.0)
Monocytes Relative: 8.1 % (ref 3.0–12.0)
NEUTROS ABS: 3.6 10*3/uL (ref 1.4–7.7)
NEUTROS PCT: 62.8 % (ref 43.0–77.0)
PLATELETS: 300 10*3/uL (ref 150.0–400.0)
RBC: 4.28 Mil/uL (ref 4.22–5.81)
RDW: 12.7 % (ref 11.5–15.5)
WBC: 5.8 10*3/uL (ref 4.0–10.5)

## 2016-02-16 LAB — BASIC METABOLIC PANEL
BUN: 12 mg/dL (ref 6–23)
CO2: 32 mEq/L (ref 19–32)
Calcium: 8.8 mg/dL (ref 8.4–10.5)
Chloride: 104 mEq/L (ref 96–112)
Creatinine, Ser: 0.83 mg/dL (ref 0.40–1.50)
GFR: 98.23 mL/min (ref >60.00)
Glucose, Bld: 92 mg/dL (ref 70–99)
POTASSIUM: 4.8 meq/L (ref 3.5–5.1)
SODIUM: 139 meq/L (ref 135–145)

## 2016-02-16 LAB — PSA: PSA: 2 ng/mL (ref 0.10–4.00)

## 2016-02-16 LAB — URINALYSIS, MICROSCOPIC ONLY: RBC / HPF: NONE SEEN (ref 0–?)

## 2016-02-16 LAB — POC URINALSYSI DIPSTICK (AUTOMATED)
BILIRUBIN UA: NEGATIVE
Glucose, UA: NEGATIVE
Ketones, UA: NEGATIVE
Leukocytes, UA: NEGATIVE
Nitrite, UA: NEGATIVE
PH UA: 8
Protein, UA: NEGATIVE
SPEC GRAV UA: 1.015
Urobilinogen, UA: 0.2

## 2016-02-16 LAB — HEPATIC FUNCTION PANEL
ALT: 16 U/L (ref 0–53)
AST: 19 U/L (ref 0–37)
Albumin: 4 g/dL (ref 3.5–5.2)
Alkaline Phosphatase: 55 U/L (ref 39–117)
BILIRUBIN TOTAL: 0.3 mg/dL (ref 0.2–1.2)
Bilirubin, Direct: 0.1 mg/dL (ref 0.0–0.3)
Total Protein: 6.2 g/dL (ref 6.0–8.3)

## 2016-02-16 LAB — LIPID PANEL
CHOLESTEROL: 137 mg/dL (ref 0–200)
HDL: 58 mg/dL (ref >39.00)
LDL Cholesterol: 71 mg/dL (ref 0–99)
NonHDL: 79.36
Total CHOL/HDL Ratio: 2
Triglycerides: 41 mg/dL (ref 0.0–149.0)
VLDL: 8.2 mg/dL (ref 0.0–40.0)

## 2016-02-16 LAB — TSH: TSH: 2.49 u[IU]/mL (ref 0.35–4.50)

## 2016-02-23 ENCOUNTER — Encounter: Payer: Self-pay | Admitting: Family Medicine

## 2016-02-23 ENCOUNTER — Ambulatory Visit (INDEPENDENT_AMBULATORY_CARE_PROVIDER_SITE_OTHER): Payer: PPO | Admitting: Family Medicine

## 2016-02-23 VITALS — BP 102/76 | HR 90 | Temp 98.8°F | Ht 72.0 in | Wt 179.0 lb

## 2016-02-23 DIAGNOSIS — Z23 Encounter for immunization: Secondary | ICD-10-CM | POA: Diagnosis not present

## 2016-02-23 DIAGNOSIS — R9431 Abnormal electrocardiogram [ECG] [EKG]: Secondary | ICD-10-CM | POA: Diagnosis not present

## 2016-02-23 DIAGNOSIS — L409 Psoriasis, unspecified: Secondary | ICD-10-CM | POA: Diagnosis not present

## 2016-02-23 DIAGNOSIS — Z0000 Encounter for general adult medical examination without abnormal findings: Secondary | ICD-10-CM

## 2016-02-23 DIAGNOSIS — N529 Male erectile dysfunction, unspecified: Secondary | ICD-10-CM | POA: Diagnosis not present

## 2016-02-23 DIAGNOSIS — N400 Enlarged prostate without lower urinary tract symptoms: Secondary | ICD-10-CM | POA: Diagnosis not present

## 2016-02-23 MED ORDER — PREDNISONE 10 MG PO TABS: ORAL_TABLET | ORAL | 0 refills | Status: DC

## 2016-02-23 MED ORDER — SILDENAFIL CITRATE 20 MG PO TABS
ORAL_TABLET | ORAL | 10 refills | Status: DC
Start: 2016-02-23 — End: 2017-02-25

## 2016-02-23 NOTE — Progress Notes (Signed)
Subjective:    Patient ID: Todd Carpenter, male    DOB: 1950-09-26, 66 y.o.   MRN: [id number]  HPI  Todd Carpenter is a 66 year old male who presents today for routine physical exam. History of abnormal EKG, family history of CAD.  He is one of 8 children; mother died when he was 89 years old and he was adopted at that time to a family who took excellent care of him.  He has been reunited with his siblings and 3 of his brothers had CAD. EKG 02/09/15 showed old anterior infarct patter, poor R-wave progression. He denies chest pain, SOB, palpitations, syncope, edema, and orthopnea.  ETT 03/03/15 indicated good exercise tolerance; no chest pain; normal BP response; no ST changes; negative adequate ETT. He was advised to continue primary prevention measures.    He quit smoking in 1996.  History of psoriasis that is mainly contained to his leg.  He is treated with Dovenox and Lidex which has provided excellent benefit for many years.  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  History of erectile dysfunction and will use Viagra when needed. He states that he uses this medication on a "rare" basis.  BPH:  He denies urinary symptoms today; no difficulty starting/stopping stream, nocturia, urgency, frequency, or hematuria.  Dental: Saw dentist over 18 months ago and reports that he does not seek this care on a regular bais Vision: Last visit 2017; he reports seeking care every 2 years Colonoscopy: Last colonoscopy 2016; mild diverticulosis;otherwise normal;  follow up in 10 years Immunizations:  Recommend Pneumovax 23 and influenza vaccine  Drinks water and reports drinking only 6 soft drinks in a year.   Review of Systems Constitutional: No fever, chills, significant weight change, fatigue, weakness or night sweats Eyes: No redness, discharge, pain, blurred vision, double vision, or loss of vision ENT/mouth: No nasal congestion, postnasal drainage,epistaxis,  purulent discharge, earache, hearing loss, tinnitus ,sore throat  dental pain, or hoarseness   Cardiovascular: no chest pain, palpitations, racing, irregular rhythm, syncope, nausea, sweating, claudication, or edema  Respiratory: No cough, sputum production,hemoptysis,  dyspnea, paroxysmal nocturnal dyspnea, pleuritic chest pain, significant snoring, or  apnea    Gastrointestinal: No heartburn,dysphagia, nausea and vomiting,ominal pain, change in bowels, anorexia, diarrhea, significant constipation, rectal bleeding, melena,  stool incontinence or jaundice Genitourinary: No dysuria,hematuria, pyuria, frequency, urgency,  incontinence, nocturia, dark urine or flank pain Musculoskeletal: No myalgias or muscle cramping, joint stiffness, joint swelling, joint color change, weakness, or cyanosis Dermatologic: No rash, pruritus, urticaria, or change in color or temperature of skin Neurologic: No headache, vertigo, limb weakness, tremor, gait disturbance, seizures, memory loss, numbness or tingling Psychiatric: No significant anxiety or depression, anhedonia, panic attacks, insomnia, or anorexia Endocrine: No change in hair/skin/ nails, excessive thirst, excessive hunger, excessive urination, or unexplained fatigue Hematologic/lymphatic: No bruising, lymphadenopathy,or  abnormal clotting Allergy/immunology: No itchy/ watery eyes, abnormal sneezing, rhinitis, urticaria ,or angioedema    Objective:   Physical Exam Physical Exam  Constitutional: He is oriented to person, place, and time. He appears well-developed and well-nourished. No distress.  HENT:  Head: Normocephalic and atraumatic.  Right Ear: Tympanic membrane and ear canal normal.  Left Ear: Tympanic membrane and ear canal normal.  Mouth/Throat: Oropharynx is clear and moist.  Eyes: Pupils are equal, round, and reactive to light. No scleral icterus.  Neck: Normal range of motion. No thyromegaly present.  Cardiovascular: Normal rate and regular  rhythm.  No murmur heard. Pulmonary/Chest: Effort normal and breath sounds normal. No respiratory distress. He has no wheezes. He has no rales. He exhibits no tenderness.  Abdominal: Soft. Bowel sounds are normal. He exhibits no distension and no mass. There is no tenderness. There is no rebound and no guarding.  Musculoskeletal: He exhibits no edema.  Lymphadenopathy:    He has no cervical adenopathy.  Neurological: He is alert and oriented to person, place, and time. He has normal reflexes. He exhibits normal muscle tone. Coordination normal.  Skin: Skin is warm and dry. Erythematous papules/plaques with silver scale approximately 2 cm x 4 cm on lower extremities bilaterally  Psychiatric: He has a normal mood and affect. His behavior is normal. Judgment and thought content normal.      Assessment & Plan:  1. Routine general medical examination at a health care facility 66 y.o. male presenting for annual physical.  Health Maintenance counseling: 1. Anticipatory guidance: Patient counseled regarding regular dental exams, eye exams, wearing seatbelts.  2. Risk factor reduction:  Advised patient of need for regular exercise and diet rich and fruits and vegetables to reduce risk of heart attack and stroke.  3. Immunizations/screenings/ancillary studies Immunization History  Administered Date(s) Administered  . Influenza Split 09/18/2011  . Influenza,inj,Quad PF,36+ Mos 10/13/2012, 11/12/2013  . Pneumococcal Conjugate-13 11/12/2013  . Td 01/08/1997, 07/14/2007   Health Maintenance Due  Topic Date Due  . Hepatitis C Screening  August 15, 1950  . ZOSTAVAX  02/24/2009   4. Prostate cancer screening- PSA 2.00 Lab Results  Component Value Date   PSA 2.00 02/16/2016   PSA 1.71 02/03/2015   PSA 1.82 10/26/2013   5. Colon cancer screening - Due in 2026 6. Skin cancer screening- Advised use of sunscreen; Back, arms, and legs examined; no suspicious lesions noted; scattered seborrheic keratosis  noted.   2. Nonspecific abnormal electrocardiogram (ECG) (EKG) Asymptomatic; followed by cardiology; normal ETT;   3. Erectile dysfunction, unspecified erectile dysfunction type Rare use of sildenafil; provided written prescription for patient  4. Benign prostatic hyperplasia without lower urinary tract symptoms Asymptomatic; PSA 2.00; PSA 2 years ago 1.82 and one year ago was 1.71. We discussed velocity of rise is <.75 and he is asymptomatic. Will evaluate in one year or sooner if he is experiencing any symptoms.     5. Psoriasis Stable; mild pruritis noted; will provide short taper of prednisone for control of itch; advised continued use of Lidex and Dovonox and follow up if itching becomes a concern - predniSONE (DELTASONE) 10 MG tablet; Take 4 tablets once daily for 2 days, 3 tabs daily for 2 days, 2 tabs daily for 2 days, 1 tab daily for 2 days.  Dispense: 20 tablet; Refill: 0  Follow up in one year or sooner if needed. Delano Metz, FNP-C

## 2016-02-23 NOTE — Progress Notes (Signed)
Pre visit review using our clinic review tool, if applicable. No additional management support is needed unless otherwise documented below in the visit note. 

## 2016-02-23 NOTE — Patient Instructions (Signed)
It was a pleasure to see you today! Please continue with your exercise and diet. You are doing a great job! Follow up in one year or sooner if needed.  Health Maintenance, Male A healthy lifestyle and preventative care can promote health and wellness.  Maintain regular health, dental, and eye exams.  Eat a healthy diet. Foods like vegetables, fruits, whole grains, low-fat dairy products, and lean protein foods contain the nutrients you need and are low in calories. Decrease your intake of foods high in solid fats, added sugars, and salt. Get information about a proper diet from your health care provider, if necessary.  Regular physical exercise is one of the most important things you can do for your health. Most adults should get at least 150 minutes of moderate-intensity exercise (any activity that increases your heart rate and causes you to sweat) each week. In addition, most adults need muscle-strengthening exercises on 2 or more days a week.   Maintain a healthy weight. The body mass index (BMI) is a screening tool to identify possible weight problems. It provides an estimate of body fat based on height and weight. Your health care provider can find your BMI and can help you achieve or maintain a healthy weight. For males 20 years and older:  A BMI below 18.5 is considered underweight.  A BMI of 18.5 to 24.9 is normal.  A BMI of 25 to 29.9 is considered overweight.  A BMI of 30 and above is considered obese.  Maintain normal blood lipids and cholesterol by exercising and minimizing your intake of saturated fat. Eat a balanced diet with plenty of fruits and vegetables. Blood tests for lipids and cholesterol should begin at age 71 and be repeated every 5 years. If your lipid or cholesterol levels are high, you are over age 7, or you are at high risk for heart disease, you may need your cholesterol levels checked more frequently.Ongoing high lipid and cholesterol levels should be treated  with medicines if diet and exercise are not working.  If you smoke, find out from your health care provider how to quit. If you do not use tobacco, do not start.  Lung cancer screening is recommended for adults aged 101-80 years who are at high risk for developing lung cancer because of a history of smoking. A yearly low-dose CT scan of the lungs is recommended for people who have at least a 30-pack-year history of smoking and are current smokers or have quit within the past 15 years. A pack year of smoking is smoking an average of 1 pack of cigarettes a day for 1 year (for example, a 30-pack-year history of smoking could mean smoking 1 pack a day for 30 years or 2 packs a day for 15 years). Yearly screening should continue until the smoker has stopped smoking for at least 15 years. Yearly screening should be stopped for people who develop a health problem that would prevent them from having lung cancer treatment.  If you choose to drink alcohol, do not have more than 2 drinks per day. One drink is considered to be 12 oz (360 mL) of beer, 5 oz (150 mL) of wine, or 1.5 oz (45 mL) of liquor.  Avoid the use of street drugs. Do not share needles with anyone. Ask for help if you need support or instructions about stopping the use of drugs.  High blood pressure causes heart disease and increases the risk of stroke. High blood pressure is more likely to develop  in:  People who have blood pressure in the end of the normal range (100-139/85-89 mm Hg).  People who are overweight or obese.  People who are African American.  If you are 65-11 years of age, have your blood pressure checked every 3-5 years. If you are 15 years of age or older, have your blood pressure checked every year. You should have your blood pressure measured twice-once when you are at a hospital or clinic, and once when you are not at a hospital or clinic. Record the average of the two measurements. To check your blood pressure when you are  not at a hospital or clinic, you can use:  An automated blood pressure machine at a pharmacy.  A home blood pressure monitor.  If you are 28-62 years old, ask your health care provider if you should take aspirin to prevent heart disease.  Diabetes screening involves taking a blood sample to check your fasting blood sugar level. This should be done once every 3 years after age 55 if you are at a normal weight and without risk factors for diabetes. Testing should be considered at a younger age or be carried out more frequently if you are overweight and have at least 1 risk factor for diabetes.  Colorectal cancer can be detected and often prevented. Most routine colorectal cancer screening begins at the age of 81 and continues through age 42. However, your health care provider may recommend screening at an earlier age if you have risk factors for colon cancer. On a yearly basis, your health care provider may provide home test kits to check for hidden blood in the stool. A small camera at the end of a tube may be used to directly examine the colon (sigmoidoscopy or colonoscopy) to detect the earliest forms of colorectal cancer. Talk to your health care provider about this at age 45 when routine screening begins. A direct exam of the colon should be repeated every 5-10 years through age 39, unless early forms of precancerous polyps or small growths are found.  People who are at an increased risk for hepatitis B should be screened for this virus. You are considered at high risk for hepatitis B if:  You were born in a country where hepatitis B occurs often. Talk with your health care provider about which countries are considered high risk.  Your parents were born in a high-risk country and you have not received a shot to protect against hepatitis B (hepatitis B vaccine).  You have HIV or AIDS.  You use needles to inject street drugs.  You live with, or have sex with, someone who has hepatitis  B.  You are a man who has sex with other men (MSM).  You get hemodialysis treatment.  You take certain medicines for conditions like cancer, organ transplantation, and autoimmune conditions.  Hepatitis C blood testing is recommended for all people born from [birth year] through 1966 and any individual with known risk factors for hepatitis C.  Healthy men should no longer receive prostate-specific antigen (PSA) blood tests as part of routine cancer screening. Talk to your health care provider about prostate cancer screening.  Testicular cancer screening is not recommended for adolescents or adult males who have no symptoms. Screening includes self-exam, a health care provider exam, and other screening tests. Consult with your health care provider about any symptoms you have or any concerns you have about testicular cancer.  Practice safe sex. Use condoms and avoid high-risk sexual practices to reduce the  spread of sexually transmitted infections (STIs).  You should be screened for STIs, including gonorrhea and chlamydia if:  You are sexually active and are younger than 24 years.  You are older than 24 years, and your health care provider tells you that you are at risk for this type of infection.  Your sexual activity has changed since you were last screened, and you are at an increased risk for chlamydia or gonorrhea. Ask your health care provider if you are at risk.  If you are at risk of being infected with HIV, it is recommended that you take a prescription medicine daily to prevent HIV infection. This is called pre-exposure prophylaxis (PrEP). You are considered at risk if:  You are a man who has sex with other men (MSM).  You are a heterosexual man who is sexually active with multiple partners.  You take drugs by injection.  You are sexually active with a partner who has HIV.  Talk with your health care provider about whether you are at high risk of being infected with HIV. If you  choose to begin PrEP, you should first be tested for HIV. You should then be tested every 3 months for as long as you are taking PrEP.  Use sunscreen. Apply sunscreen liberally and repeatedly throughout the day. You should seek shade when your shadow is shorter than you. Protect yourself by wearing long sleeves, pants, a wide-brimmed hat, and sunglasses year round whenever you are outdoors.  Tell your health care provider of new moles or changes in moles, especially if there is a change in shape or color. Also, tell your health care provider if a mole is larger than the size of a pencil eraser.  A one-time screening for abdominal aortic aneurysm (AAA) and surgical repair of large AAAs by ultrasound is recommended for men aged 55-75 years who are current or former smokers.  Stay current with your vaccines (immunizations). This information is not intended to replace advice given to you by your health care provider. Make sure you discuss any questions you have with your health care provider. Document Released: 06/23/2007 Document Revised: 01/15/2014 Document Reviewed: 09/28/2014 Elsevier Interactive Patient Education  2017 [employer].

## 2016-08-03 ENCOUNTER — Other Ambulatory Visit: Payer: Self-pay | Admitting: Family Medicine

## 2016-08-03 DIAGNOSIS — L409 Psoriasis, unspecified: Secondary | ICD-10-CM

## 2016-08-06 ENCOUNTER — Other Ambulatory Visit: Payer: Self-pay

## 2016-08-06 DIAGNOSIS — L409 Psoriasis, unspecified: Secondary | ICD-10-CM

## 2016-08-06 MED ORDER — TRIAMCINOLONE ACETONIDE 0.5 % EX OINT: 1.0000 "application " | TOPICAL_OINTMENT | Freq: Two times a day (BID) | CUTANEOUS | 1 refills | Status: DC

## 2016-08-06 MED ORDER — FLUOCINONIDE 0.05 % EX CREA: TOPICAL_CREAM | Freq: Two times a day (BID) | CUTANEOUS | 3 refills | Status: DC

## 2016-08-06 MED ORDER — CALCIPOTRIENE 0.005 % EX CREA: TOPICAL_CREAM | Freq: Two times a day (BID) | CUTANEOUS | 3 refills | Status: DC

## 2016-11-27 ENCOUNTER — Telehealth: Payer: Self-pay | Admitting: Family Medicine

## 2016-11-27 NOTE — Telephone Encounter (Signed)
Copied from [place] [id number]. Topic: Inquiry >> Nov 27, 2016  3:47 PM Cecelia Byars, NT wrote: Reason for CRM: Patient would like to transfer care from Dr, Sherren Mocha to Dr.Aron  at [facility]  please call [phone]

## 2016-11-28 NOTE — Telephone Encounter (Signed)
FYI-will this be ok?

## 2016-12-04 NOTE — Telephone Encounter (Signed)
TA-This pt is requesting to transfer care to you/are you in agreement? Plz advise/thx dmf

## 2016-12-06 NOTE — Telephone Encounter (Signed)
Yes okay with me.

## 2016-12-06 NOTE — Telephone Encounter (Signed)
I have called and left contact information for [facility] for patient to call and schedule appointment. Patient was approved to transfer to [employer] [facility] to see Dr. Deborra Medina.

## 2016-12-06 NOTE — Telephone Encounter (Signed)
Please help patient get scheduled with Dr. Deborra Medina.  Thank you! :)

## 2017-02-25 ENCOUNTER — Ambulatory Visit (INDEPENDENT_AMBULATORY_CARE_PROVIDER_SITE_OTHER): Payer: PPO | Admitting: Family Medicine

## 2017-02-25 ENCOUNTER — Encounter: Payer: Self-pay | Admitting: Family Medicine

## 2017-02-25 VITALS — BP 130/80 | HR 62 | Ht 72.0 in | Wt 180.2 lb

## 2017-02-25 DIAGNOSIS — Z0000 Encounter for general adult medical examination without abnormal findings: Secondary | ICD-10-CM | POA: Diagnosis not present

## 2017-02-25 DIAGNOSIS — Z91199 Patient's noncompliance with other medical treatment and regimen due to unspecified reason: Secondary | ICD-10-CM

## 2017-02-25 DIAGNOSIS — Z9119 Patient's noncompliance with other medical treatment and regimen: Secondary | ICD-10-CM

## 2017-02-25 DIAGNOSIS — N400 Enlarged prostate without lower urinary tract symptoms: Secondary | ICD-10-CM | POA: Diagnosis not present

## 2017-02-25 DIAGNOSIS — M799 Soft tissue disorder, unspecified: Secondary | ICD-10-CM

## 2017-02-25 DIAGNOSIS — F528 Other sexual dysfunction not due to a substance or known physiological condition: Secondary | ICD-10-CM | POA: Diagnosis not present

## 2017-02-25 DIAGNOSIS — L409 Psoriasis, unspecified: Secondary | ICD-10-CM | POA: Diagnosis not present

## 2017-02-25 LAB — URINALYSIS, ROUTINE W REFLEX MICROSCOPIC
Bilirubin Urine: NEGATIVE
Hgb urine dipstick: NEGATIVE
KETONES UR: NEGATIVE
Leukocytes, UA: NEGATIVE
Nitrite: NEGATIVE
PH: 7.5 (ref 5.0–8.0)
RBC / HPF: NONE SEEN (ref 0–?)
SPECIFIC GRAVITY, URINE: 1.015 (ref 1.000–1.030)
Total Protein, Urine: NEGATIVE
URINE GLUCOSE: NEGATIVE
Urobilinogen, UA: 0.2 (ref 0.0–1.0)
WBC, UA: NONE SEEN (ref 0–?)

## 2017-02-25 LAB — COMPREHENSIVE METABOLIC PANEL
ALT: 20 U/L (ref 0–53)
AST: 18 U/L (ref 0–37)
Albumin: 4.6 g/dL (ref 3.5–5.2)
Alkaline Phosphatase: 67 U/L (ref 39–117)
BUN: 15 mg/dL (ref 6–23)
CALCIUM: 9.7 mg/dL (ref 8.4–10.5)
CO2: 32 meq/L (ref 19–32)
Chloride: 102 mEq/L (ref 96–112)
Creatinine, Ser: 1.03 mg/dL (ref 0.40–1.50)
GFR: 76.33 mL/min (ref >60.00)
GLUCOSE: 121 mg/dL — AB (ref 70–99)
POTASSIUM: 5.5 meq/L — AB (ref 3.5–5.1)
Sodium: 142 mEq/L (ref 135–145)
Total Bilirubin: 0.6 mg/dL (ref 0.2–1.2)
Total Protein: 7 g/dL (ref 6.0–8.3)

## 2017-02-25 LAB — LIPID PANEL
CHOL/HDL RATIO: 3
Cholesterol: 151 mg/dL (ref 0–200)
HDL: 55.2 mg/dL (ref >39.00)
LDL Cholesterol: 81 mg/dL (ref 0–99)
NONHDL: 95.49
TRIGLYCERIDES: 71 mg/dL (ref 0.0–149.0)
VLDL: 14.2 mg/dL (ref 0.0–40.0)

## 2017-02-25 LAB — CBC
HEMATOCRIT: 45.1 % (ref 39.0–52.0)
HEMOGLOBIN: 15.1 g/dL (ref 13.0–17.0)
MCHC: 33.6 g/dL (ref 30.0–36.0)
MCV: 97.6 fl (ref 78.0–100.0)
Platelets: 366 10*3/uL (ref 150.0–400.0)
RBC: 4.62 Mil/uL (ref 4.22–5.81)
RDW: 12.8 % (ref 11.5–15.5)
WBC: 6 10*3/uL (ref 4.0–10.5)

## 2017-02-25 LAB — PSA: PSA: 2.11 ng/mL (ref 0.10–4.00)

## 2017-02-25 MED ORDER — SILDENAFIL CITRATE 20 MG PO TABS: ORAL_TABLET | ORAL | 10 refills | Status: DC

## 2017-02-25 MED ORDER — OXYCODONE-ACETAMINOPHEN 5-325 MG PO TABS: 1.0000 | ORAL_TABLET | Freq: Three times a day (TID) | ORAL | 0 refills | Status: DC | PRN

## 2017-02-25 NOTE — Progress Notes (Addendum)
Subjective:  Patient ID: Todd Carpenter, male    DOB: 05/14/1950  Age: 67 y.o. MRN: [id number]  CC: Establish Care   HPI Todd Carpenter presents for the establishment of care.  He carries a past medical history of BPH, psoriasis and chronic lower back pain.  He just retired this past December after working for many years in the [employer].  He is status post back fusion.  He deals with chronic back pain.  He stays active around his house.  He lives with his wife.  He does not smoke and rarely drinks alcohol.  He does not use illicit drugs.  He does try to play some golf.  He has a history of psoriasis that he uses Dovonox in high dose of triamcinolone ointment.  He is taking prednisone for flares of his psoriasis in the past I explained to him that that is not appropriate.  He uses low-dose oxycodone every illuminating.  Tells me that 30 pills actually lasted him for 6 months.  Review of the chart shows stable blood work from last year.  Offered him a referral for second opinion of his psoriasis. History Todd Carpenter has a past medical history of BENIGN PROSTATIC HYPERTROPHY (09/18/2006), Degenerative disc disease, and PSORIASIS (07/14/2007).   He has a past surgical history that includes Neck surgery (03/16/2010) and Back surgery (5643,3295).   His family history includes Cancer in his other; Cervical cancer in his other; Heart disease in his brother, other, and sister; Ulcers in his other.He reports that he has quit smoking. he has never used smokeless tobacco. He reports that he drinks alcohol. He reports that he does not use drugs.  Outpatient Medications Prior to Visit  Medication Sig Dispense Refill  . calcipotriene (DOVONOX) 0.005 % cream Apply topically 2 (two) times daily. 120 g 3  . Ibuprofen 200 MG CAPS Take 1 capsule by mouth as needed.    . predniSONE (DELTASONE) 10 MG tablet Take 4 tablets once daily for 2 days, 3 tabs daily for 2 days, 2 tabs daily for 2 days, 1 tab daily for 2 days. 20 tablet 0   . sildenafil (REVATIO) 20 MG tablet Use as directed 10 tablet 10  . triamcinolone ointment (KENALOG) 0.5 % Apply 1 application topically 2 (two) times daily. 60 g 1   No facility-administered medications prior to visit.     ROS Review of Systems  Constitutional: Negative.   HENT: Negative.   Eyes: Negative.   Respiratory: Negative.   Cardiovascular: Negative.   Gastrointestinal: Negative.   Endocrine: Negative for polyphagia and polyuria.  Genitourinary: Negative for difficulty urinating, frequency and urgency.  Musculoskeletal: Positive for back pain.  Skin: Positive for color change and rash.  Allergic/Immunologic: Negative for immunocompromised state.  Neurological: Negative for weakness and headaches.  Hematological: Does not bruise/bleed easily.  Psychiatric/Behavioral: Negative for dysphoric mood.    Objective:  BP 130/80 (BP Location: Left Arm, Patient Position: Sitting, Cuff Size: Normal)   Pulse 62   Ht 6' (1.829 m)   Wt 180 lb 4 oz (81.8 kg)   SpO2 93%   BMI 24.45 kg/m   Physical Exam  Constitutional: He is oriented to person, place, and time. He appears well-developed and well-nourished. No distress.  HENT:  Head: Normocephalic and atraumatic.  Right Ear: External ear normal.  Left Ear: External ear normal.  Mouth/Throat: Oropharynx is clear and moist. No oropharyngeal exudate.  Eyes: Conjunctivae are normal. Pupils are equal, round, and reactive to light.  Right eye exhibits no discharge. Left eye exhibits no discharge. No scleral icterus.  Neck: Neck supple. No JVD present. No tracheal deviation present. No thyromegaly present.  Cardiovascular: Normal rate, regular rhythm and normal heart sounds.  Pulmonary/Chest: Effort normal and breath sounds normal. No stridor.  Abdominal: Soft. Bowel sounds are normal. He exhibits no distension. There is no tenderness. There is no rebound and no guarding.  Genitourinary: Rectal exam shows no external hemorrhoid, no  internal hemorrhoid, no fissure, no mass, no tenderness, anal tone normal and guaiac negative stool. Prostate is enlarged. Prostate is not tender.  Lymphadenopathy:    He has no cervical adenopathy.  Neurological: He is alert and oriented to person, place, and time.  Skin: Skin is warm and dry. He is not diaphoretic.  Psychiatric: He has a normal mood and affect. His behavior is normal.      Assessment & Plan:   [name] was seen today for establish care.  Diagnoses and all orders for this visit:  ERECTILE DYSFUNCTION -     sildenafil (REVATIO) 20 MG tablet; Use as directed -     Lipid panel  Psoriasis -     CBC -     Comprehensive metabolic panel  Musculoskeletal disorder -     oxyCODONE-acetaminophen (PERCOCET/ROXICET) 5-325 MG tablet; Take 1 tablet by mouth every 8 (eight) hours as needed for severe pain. -     CBC  Benign prostatic hyperplasia without lower urinary tract symptoms -     CBC -     PSA -     Urinalysis, Routine w reflex microscopic  Health care maintenance -     CBC -     Comprehensive metabolic panel -     Lipid panel -     Urinalysis, Routine w reflex microscopic -     Hepatitis C antibody  Medically noncompliant   I have discontinued Todd Carpenter's Ibuprofen, predniSONE, calcipotriene, and triamcinolone ointment. I am also having him start on oxyCODONE-acetaminophen. Additionally, I am having him maintain his sildenafil.  Meds ordered this encounter  Medications  . oxyCODONE-acetaminophen (PERCOCET/ROXICET) 5-325 MG tablet    Sig: Take 1 tablet by mouth every 8 (eight) hours as needed for severe pain.    Dispense:  30 tablet    Refill:  0  . sildenafil (REVATIO) 20 MG tablet    Sig: Use as directed    Dispense:  10 tablet    Refill:  10   2/20: Pt refused to return for repeat potassium.  Follow-up: Return in about 6 months (around 08/25/2017).  Libby Maw, MD

## 2017-02-26 ENCOUNTER — Other Ambulatory Visit: Payer: Self-pay

## 2017-02-26 DIAGNOSIS — E875 Hyperkalemia: Secondary | ICD-10-CM

## 2017-02-26 LAB — HEPATITIS C ANTIBODY
Hepatitis C Ab: NONREACTIVE
SIGNAL TO CUT-OFF: 0.01 (ref <1.00)

## 2017-02-27 DIAGNOSIS — Z9119 Patient's noncompliance with other medical treatment and regimen: Secondary | ICD-10-CM | POA: Insufficient documentation

## 2017-02-27 DIAGNOSIS — Z91199 Patient's noncompliance with other medical treatment and regimen due to unspecified reason: Secondary | ICD-10-CM | POA: Insufficient documentation

## 2017-03-05 ENCOUNTER — Other Ambulatory Visit (INDEPENDENT_AMBULATORY_CARE_PROVIDER_SITE_OTHER): Payer: PPO

## 2017-03-05 DIAGNOSIS — E875 Hyperkalemia: Secondary | ICD-10-CM | POA: Diagnosis not present

## 2017-03-05 LAB — POTASSIUM: POTASSIUM: 4.7 meq/L (ref 3.5–5.1)

## 2018-01-20 DIAGNOSIS — G5603 Carpal tunnel syndrome, bilateral upper limbs: Secondary | ICD-10-CM | POA: Diagnosis not present

## 2018-01-23 DIAGNOSIS — G5603 Carpal tunnel syndrome, bilateral upper limbs: Secondary | ICD-10-CM | POA: Diagnosis not present

## 2018-01-31 DIAGNOSIS — G5602 Carpal tunnel syndrome, left upper limb: Secondary | ICD-10-CM | POA: Diagnosis not present

## 2018-02-26 ENCOUNTER — Encounter: Payer: Self-pay | Admitting: Family Medicine

## 2018-02-26 ENCOUNTER — Ambulatory Visit (INDEPENDENT_AMBULATORY_CARE_PROVIDER_SITE_OTHER): Payer: PPO | Admitting: Family Medicine

## 2018-02-26 VITALS — BP 120/70 | HR 57 | Ht 72.0 in | Wt 173.5 lb

## 2018-02-26 DIAGNOSIS — E875 Hyperkalemia: Secondary | ICD-10-CM | POA: Diagnosis not present

## 2018-02-26 DIAGNOSIS — H6123 Impacted cerumen, bilateral: Secondary | ICD-10-CM | POA: Diagnosis not present

## 2018-02-26 DIAGNOSIS — Z0000 Encounter for general adult medical examination without abnormal findings: Secondary | ICD-10-CM

## 2018-02-26 DIAGNOSIS — Z0001 Encounter for general adult medical examination with abnormal findings: Secondary | ICD-10-CM

## 2018-02-26 LAB — BASIC METABOLIC PANEL
BUN: 15 mg/dL (ref 6–23)
CALCIUM: 8.7 mg/dL (ref 8.4–10.5)
CHLORIDE: 97 meq/L (ref 96–112)
CO2: 28 mEq/L (ref 19–32)
CREATININE: 0.82 mg/dL (ref 0.40–1.50)
GFR: 93.15 mL/min (ref >60.00)
Glucose, Bld: 81 mg/dL (ref 70–99)
Potassium: 4.6 mEq/L (ref 3.5–5.1)
Sodium: 131 mEq/L — ABNORMAL LOW (ref 135–145)

## 2018-02-26 MED ORDER — EAR WAX CLEANSING 6.5 % OT KIT: PACK | OTIC | 1 refills | Status: DC

## 2018-02-26 NOTE — Progress Notes (Signed)
Established Patient Office Visit  Subjective:  Patient ID: Todd Carpenter, male    DOB: [birth year]  Age: 68 y.o. MRN: [id number]  CC:  Chief Complaint  Patient presents with  . Annual Exam    HPI Todd Carpenter presents for complete physical exam.  Blood work checked a few weeks ago was okay.  There was hyperkalemia.  We will follow-up on that today.  Developed a URI over the weekend that is improving.  He was upset that I was 15 minutes late seeing him this morning and I apologized.  Past Medical History:  Diagnosis Date  . BENIGN PROSTATIC HYPERTROPHY 09/18/2006  . Degenerative disc disease   . PSORIASIS 07/14/2007    Past Surgical History:  Procedure Laterality Date  . BACK SURGERY  [phone]  . NECK SURGERY  03/16/2010    Family History  Problem Relation Age of Onset  . Cervical cancer Other   . Cancer Other        prostate cancer  . Ulcers Other   . Heart disease Other   . Heart disease Sister   . Heart disease Brother   . Colon cancer Neg Hx     Social History   Socioeconomic History  . Marital status: Married    Spouse name: Not on file  . Number of children: Not on file  . Years of education: Not on file  . Highest education level: Not on file  Occupational History  . Not on file  Social Needs  . Financial resource strain: Not on file  . Food insecurity:    Worry: Not on file    Inability: Not on file  . Transportation needs:    Medical: Not on file    Non-medical: Not on file  Tobacco Use  . Smoking status: Former Research scientist (life sciences)  . Smokeless tobacco: Never Used  Substance and Sexual Activity  . Alcohol use: Yes    Alcohol/week: 0.0 standard drinks    Comment: 3-4 times per year,per patient.   . Drug use: No  . Sexual activity: Not on file  Lifestyle  . Physical activity:    Days per week: Not on file    Minutes per session: Not on file  . Stress: Not on file  Relationships  . Social connections:    Talks on phone: Not on file    Gets together: Not  on file    Attends religious service: Not on file    Active member of club or organization: Not on file    Attends meetings of clubs or organizations: Not on file    Relationship status: Not on file  . Intimate partner violence:    Fear of current or ex partner: Not on file    Emotionally abused: Not on file    Physically abused: Not on file    Forced sexual activity: Not on file  Other Topics Concern  . Not on file  Social History Narrative  . Not on file    Outpatient Medications Prior to Visit  Medication Sig Dispense Refill  . sildenafil (REVATIO) 20 MG tablet Use as directed 10 tablet 10  . oxyCODONE-acetaminophen (PERCOCET/ROXICET) 5-325 MG tablet Take 1 tablet by mouth every 8 (eight) hours as needed for severe pain. 30 tablet 0   No facility-administered medications prior to visit.     No Known Allergies  ROS Review of Systems  Constitutional: Negative for diaphoresis, fatigue, fever and unexpected weight change.  HENT: Positive for congestion, postnasal  drip and rhinorrhea.   Eyes: Negative for photophobia and visual disturbance.  Respiratory: Negative.   Cardiovascular: Negative.   Gastrointestinal: Negative.   Endocrine: Negative for polyphagia and polyuria.  Genitourinary: Negative.  Negative for difficulty urinating and frequency.  Musculoskeletal: Positive for back pain.  Skin: Negative for pallor and rash.  Neurological: Negative for light-headedness and headaches.  Hematological: Does not bruise/bleed easily.  Psychiatric/Behavioral: Negative.       Objective:    Physical Exam  Constitutional: He is oriented to person, place, and time. He appears well-developed and well-nourished. No distress.  HENT:  Head: Normocephalic and atraumatic.  Right Ear: Tympanic membrane and external ear normal. A foreign body is present.  Left Ear: Tympanic membrane and external ear normal. A foreign body is present.  Ears:  Mouth/Throat: Oropharynx is clear and  moist. No oropharyngeal exudate.  Eyes: Pupils are equal, round, and reactive to light. Conjunctivae are normal. Right eye exhibits no discharge. Left eye exhibits no discharge. No scleral icterus.  Neck: Neck supple. No JVD present. No tracheal deviation present. No thyromegaly present.  Cardiovascular: Normal rate, regular rhythm and normal heart sounds.  Pulmonary/Chest: Effort normal and breath sounds normal. No stridor.  Abdominal: Bowel sounds are normal.  Genitourinary:     Lymphadenopathy:    He has no cervical adenopathy.  Neurological: He is alert and oriented to person, place, and time.  Skin: Skin is warm and dry. He is not diaphoretic.  Psychiatric: He has a normal mood and affect. His behavior is normal.    BP 120/70   Pulse (!) 57   Ht 6' (1.829 m)   Wt 173 lb 8 oz (78.7 kg)   SpO2 97%   BMI 23.53 kg/m  Wt Readings from Last 3 Encounters:  02/26/18 173 lb 8 oz (78.7 kg)  02/25/17 180 lb 4 oz (81.8 kg)  02/23/16 179 lb (81.2 kg)   BP Readings from Last 3 Encounters:  02/26/18 120/70  02/25/17 130/80  02/23/16 102/76   Guideline developer:  UpToDate (see UpToDate for funding source) Date Released: June 2014  There are no preventive care reminders to display for this patient.  There are no preventive care reminders to display for this patient.  Lab Results  Component Value Date   TSH 2.49 02/16/2016   Lab Results  Component Value Date   WBC 6.0 02/25/2017   HGB 15.1 02/25/2017   HCT 45.1 02/25/2017   MCV 97.6 02/25/2017   PLT 366.0 02/25/2017   Lab Results  Component Value Date   NA 142 02/25/2017   K 4.7 03/05/2017   CO2 32 02/25/2017   GLUCOSE 121 (H) 02/25/2017   BUN 15 02/25/2017   CREATININE 1.03 02/25/2017   BILITOT 0.6 02/25/2017   ALKPHOS 67 02/25/2017   AST 18 02/25/2017   ALT 20 02/25/2017   PROT 7.0 02/25/2017   ALBUMIN 4.6 02/25/2017   CALCIUM 9.7 02/25/2017   GFR 76.33 02/25/2017   Lab Results  Component Value Date    CHOL 151 02/25/2017   Lab Results  Component Value Date   HDL 55.20 02/25/2017   Lab Results  Component Value Date   LDLCALC 81 02/25/2017   Lab Results  Component Value Date   TRIG 71.0 02/25/2017   Lab Results  Component Value Date   CHOLHDL 3 02/25/2017   No results found for: HGBA1C    Assessment & Plan:   Problem List Items Addressed This Visit      Nervous  and Auditory   Excessive cerumen in both ear canals   Relevant Medications   Carbamide Peroxide-Saline (EAR WAX CLEANSING) 6.5 % KIT     Other   Healthcare maintenance - Primary   Hyperkalemia   Relevant Orders   Basic metabolic panel      Meds ordered this encounter  Medications  . Carbamide Peroxide-Saline (EAR WAX CLEANSING) 6.5 % KIT    Sig: Follow kit instructions    Dispense:  1 kit    Refill:  1    Follow-up: Return in about 6 months (around 08/27/2018), or if symptoms worsen or fail to improve.   Patient was given information on health maintenance and disease prevention.  He will use the earwax removal kit and follow-up as needed for dedicated visit for ear lavage.  Follow-up on hyperkalemia today.

## 2018-02-26 NOTE — Patient Instructions (Signed)
Health Maintenance After Age 69 After age 69, you are at a higher risk for certain long-term diseases and infections as well as injuries from falls. Falls are a major cause of broken bones and head injuries in people who are older than age 69. Getting regular preventive care can help to keep you healthy and well. Preventive care includes getting regular testing and making lifestyle changes as recommended by your health care provider. Talk with your health care provider about:  Which screenings and tests you should have. A screening is a test that checks for a disease when you have no symptoms.  A diet and exercise plan that is right for you. What should I know about screenings and tests to prevent falls? Screening and testing are the best ways to find a health problem early. Early diagnosis and treatment give you the best chance of managing medical conditions that are common after age 69. Certain conditions and lifestyle choices may make you more likely to have a fall. Your health care provider may recommend:  Regular vision checks. Poor vision and conditions such as cataracts can make you more likely to have a fall. If you wear glasses, make sure to get your prescription updated if your vision changes.  Medicine review. Work with your health care provider to regularly review all of the medicines you are taking, including over-the-counter medicines. Ask your health care provider about any side effects that may make you more likely to have a fall. Tell your health care provider if any medicines that you take make you feel dizzy or sleepy.  Osteoporosis screening. Osteoporosis is a condition that causes the bones to get weaker. This can make the bones weak and cause them to break more easily.  Blood pressure screening. Blood pressure changes and medicines to control blood pressure can make you feel dizzy.  Strength and balance checks. Your health care provider may recommend certain tests to check your  strength and balance while standing, walking, or changing positions.  Foot health exam. Foot pain and numbness, as well as not wearing proper footwear, can make you more likely to have a fall.  Depression screening. You may be more likely to have a fall if you have a fear of falling, feel emotionally low, or feel unable to do activities that you used to do.  Alcohol use screening. Using too much alcohol can affect your balance and may make you more likely to have a fall. What actions can I take to lower my risk of falls? General instructions  Talk with your health care provider about your risks for falling. Tell your health care provider if: ? You fall. Be sure to tell your health care provider about all falls, even ones that seem minor. ? You feel dizzy, sleepy, or off-balance.  Take over-the-counter and prescription medicines only as told by your health care provider. These include any supplements.  Eat a healthy diet and maintain a healthy weight. A healthy diet includes low-fat dairy products, low-fat (lean) meats, and fiber from whole grains, beans, and lots of fruits and vegetables. Home safety  Remove any tripping hazards, such as rugs, cords, and clutter.  Install safety equipment such as grab bars in bathrooms and safety rails on stairs.  Keep rooms and walkways well-lit. Activity   Follow a regular exercise program to stay fit. This will help you maintain your balance. Ask your health care provider what types of exercise are appropriate for you.  If you need a cane or   walker, use it as recommended by your health care provider.  Wear supportive shoes that have nonskid soles. Lifestyle  Do not drink alcohol if your health care provider tells you not to drink.  If you drink alcohol, limit how much you have: ? 0-1 drink a day for women. ? 0-2 drinks a day for men.  Be aware of how much alcohol is in your drink. In the [place] one drink equals one typical bottle of beer (12  oz), one-half glass of wine (5 oz), or one shot of hard liquor (1 oz).  Do not use any products that contain nicotine or tobacco, such as cigarettes and e-cigarettes. If you need help quitting, ask your health care provider. Summary  Having a healthy lifestyle and getting preventive care can help to protect your health and wellness after age 69.  Screening and testing are the best way to find a health problem early and help you avoid having a fall. Early diagnosis and treatment give you the best chance for managing medical conditions that are more common for people who are older than age 69.  Falls are a major cause of broken bones and head injuries in people who are older than age 69. Take precautions to prevent a fall at home.  Work with your health care provider to learn what changes you can make to improve your health and wellness and to prevent falls. This information is not intended to replace advice given to you by your health care provider. Make sure you discuss any questions you have with your health care provider. Document Released: 11/07/2016 Document Revised: 11/07/2016 Document Reviewed: 11/07/2016 Elsevier Interactive Patient Education  2019 Elsevier Inc.  Earwax Buildup, Adult The ears produce a substance called earwax that helps keep bacteria out of the ear and protects the skin in the ear canal. Occasionally, earwax can build up in the ear and cause discomfort or hearing loss. What increases the risk? This condition is more likely to develop in people who:  Are male.  Are elderly.  Naturally produce more earwax.  Clean their ears often with cotton swabs.  Use earplugs often.  Use in-ear headphones often.  Wear hearing aids.  Have narrow ear canals.  Have earwax that is overly thick or sticky.  Have eczema.  Are dehydrated.  Have excess hair in the ear canal. What are the signs or symptoms? Symptoms of this condition include:  Reduced or muffled  hearing.  A feeling of fullness in the ear or feeling that the ear is plugged.  Fluid coming from the ear.  Ear pain.  Ear itch.  Ringing in the ear.  Coughing.  An obvious piece of earwax that can be seen inside the ear canal. How is this diagnosed? This condition may be diagnosed based on:  Your symptoms.  Your medical history.  An ear exam. During the exam, your health care provider will look into your ear with an instrument called an otoscope. You may have tests, including a hearing test. How is this treated? This condition may be treated by:  Using ear drops to soften the earwax.  Having the earwax removed by a health care provider. The health care provider may: ? Flush the ear with water. ? Use an instrument that has a loop on the end (curette). ? Use a suction device.  Surgery to remove the wax buildup. This may be done in severe cases. Follow these instructions at home:   Take over-the-counter and prescription medicines only as   told by your health care provider.  Do not put any objects, including cotton swabs, into your ear. You can clean the opening of your ear canal with a washcloth or facial tissue.  Follow instructions from your health care provider about cleaning your ears. Do not over-clean your ears.  Drink enough fluid to keep your urine clear or pale yellow. This will help to thin the earwax.  Keep all follow-up visits as told by your health care provider. If earwax builds up in your ears often or if you use hearing aids, consider seeing your health care provider for routine, preventive ear cleanings. Ask your health care provider how often you should schedule your cleanings.  If you have hearing aids, clean them according to instructions from the manufacturer and your health care provider. Contact a health care provider if:  You have ear pain.  You develop a fever.  You have blood, pus, or other fluid coming from your ear.  You have hearing  loss.  You have ringing in your ears that does not go away.  Your symptoms do not improve with treatment.  You feel like the room is spinning (vertigo). Summary  Earwax can build up in the ear and cause discomfort or hearing loss.  The most common symptoms of this condition include reduced or muffled hearing and a feeling of fullness in the ear or feeling that the ear is plugged.  This condition may be diagnosed based on your symptoms, your medical history, and an ear exam.  This condition may be treated by using ear drops to soften the earwax or by having the earwax removed by a health care provider.  Do not put any objects, including cotton swabs, into your ear. You can clean the opening of your ear canal with a washcloth or facial tissue. This information is not intended to replace advice given to you by your health care provider. Make sure you discuss any questions you have with your health care provider. Document Released: 02/02/2004 Document Revised: 12/06/2016 Document Reviewed: 03/07/2016 Elsevier Interactive Patient Education  2019 Elsevier Inc.  

## 2018-08-08 ENCOUNTER — Other Ambulatory Visit: Payer: Self-pay

## 2018-08-25 ENCOUNTER — Telehealth: Payer: Self-pay | Admitting: Family Medicine

## 2018-08-25 NOTE — Telephone Encounter (Signed)
Called pt because based on last appt Dr Ethelene Hal want pt to Return in about 6 months (around 08/27/2018), or if symptoms worsen or fail to improve, pt said that he doesn't think he needs an appt right now and that he was thinking about going back to [place] because he wasn't pleased with Dr Ethelene Hal.

## 2018-12-10 ENCOUNTER — Encounter: Payer: Self-pay | Admitting: Family Medicine

## 2018-12-10 ENCOUNTER — Other Ambulatory Visit: Payer: Self-pay

## 2018-12-10 ENCOUNTER — Telehealth: Payer: Self-pay

## 2018-12-10 ENCOUNTER — Ambulatory Visit (INDEPENDENT_AMBULATORY_CARE_PROVIDER_SITE_OTHER): Payer: PPO | Admitting: Family Medicine

## 2018-12-10 DIAGNOSIS — Z20822 Contact with and (suspected) exposure to covid-19: Secondary | ICD-10-CM

## 2018-12-10 DIAGNOSIS — U071 COVID-19: Secondary | ICD-10-CM | POA: Diagnosis not present

## 2018-12-10 MED ORDER — PREDNISONE 10 MG PO TABS: 10.0000 mg | ORAL_TABLET | Freq: Two times a day (BID) | ORAL | 0 refills | Status: AC

## 2018-12-10 NOTE — Telephone Encounter (Signed)
Spoke with Dr. Ethelene Hal, okay to send Rx for Prednisone 10 mg bid x 7 days. Pt is aware Rx was sent in to [facility].

## 2018-12-10 NOTE — Addendum Note (Signed)
Addended by: Rodrigo Ran on: 12/10/2018 03:23 PM   Modules accepted: Orders

## 2018-12-10 NOTE — Telephone Encounter (Signed)
Copied from [place] [id number]. Topic: General - Other >> Dec 10, 2018  1:23 PM Rayann Heman wrote: Reason for CRM: pt called and stated that Prednisone was supposed to be called in today but has not yet. Pt would like to check the status of this. Please advise

## 2018-12-10 NOTE — Progress Notes (Signed)
Established Patient Office Visit  Subjective:  Patient ID: Todd Carpenter, male    DOB: 07-Dec-1950  Age: 68 y.o. MRN: [id number]  CC:  Chief Complaint  Patient presents with  . covid symptoms    HPI [name] presents for evaluation and treatment of a weeklong history of fever and chills, malaise and fatigue with decreased senses of taste and smell.  He had experienced some cough with shortness of breath, first day of the illness but that is no longer an issue.  He had tried to take TheraFlu and that it caused him to vomit.  He is having arthralgias and myalgias.  His fever and body aches have responded to Tylenol.  He is tolerating that well.  Quit smoking many years ago.  He is now retired.  He lives with his wife.  She has not been affected yet.  Past Medical History:  Diagnosis Date  . BENIGN PROSTATIC HYPERTROPHY 09/18/2006  . Degenerative disc disease   . PSORIASIS 07/14/2007    Past Surgical History:  Procedure Laterality Date  . BACK SURGERY  [phone]  . NECK SURGERY  03/16/2010    Family History  Problem Relation Age of Onset  . Cervical cancer Other   . Cancer Other        prostate cancer  . Ulcers Other   . Heart disease Other   . Heart disease Sister   . Heart disease Brother   . Colon cancer Neg Hx     Social History   Socioeconomic History  . Marital status: Married    Spouse name: Not on file  . Number of children: Not on file  . Years of education: Not on file  . Highest education level: Not on file  Occupational History  . Not on file  Social Needs  . Financial resource strain: Not on file  . Food insecurity    Worry: Not on file    Inability: Not on file  . Transportation needs    Medical: Not on file    Non-medical: Not on file  Tobacco Use  . Smoking status: Former Research scientist (life sciences)  . Smokeless tobacco: Never Used  Substance and Sexual Activity  . Alcohol use: Yes    Alcohol/week: 0.0 standard drinks    Comment: 3-4 times per year,per patient.    . Drug use: No  . Sexual activity: Not on file  Lifestyle  . Physical activity    Days per week: Not on file    Minutes per session: Not on file  . Stress: Not on file  Relationships  . Social Herbalist on phone: Not on file    Gets together: Not on file    Attends religious service: Not on file    Active member of club or organization: Not on file    Attends meetings of clubs or organizations: Not on file    Relationship status: Not on file  . Intimate partner violence    Fear of current or ex partner: Not on file    Emotionally abused: Not on file    Physically abused: Not on file    Forced sexual activity: Not on file  Other Topics Concern  . Not on file  Social History Narrative  . Not on file    Outpatient Medications Prior to Visit  Medication Sig Dispense Refill  . Carbamide Peroxide-Saline (EAR WAX CLEANSING) 6.5 % KIT Follow kit instructions 1 kit 1  . sildenafil (REVATIO) 20  MG tablet Use as directed 10 tablet 10   No facility-administered medications prior to visit.     No Known Allergies  ROS Review of Systems  Constitutional: Positive for chills, fatigue and fever. Negative for diaphoresis and unexpected weight change.  HENT: Negative for congestion and postnasal drip.   Eyes: Negative for photophobia and visual disturbance.  Respiratory: Negative for cough, shortness of breath and wheezing.   Gastrointestinal: Negative for abdominal pain, diarrhea, nausea and vomiting.  Genitourinary: Negative.   Musculoskeletal: Positive for arthralgias and myalgias.  Skin: Negative for pallor and rash.  Allergic/Immunologic: Negative for immunocompromised state.  Neurological: Negative.   Hematological: Does not bruise/bleed easily.  Psychiatric/Behavioral: Negative.       Objective:    Physical Exam  Constitutional: He is oriented to person, place, and time. No distress.  Pulmonary/Chest: Effort normal.  Neurological: He is alert and oriented to  person, place, and time.  Psychiatric: He has a normal mood and affect. His behavior is normal.    There were no vitals taken for this visit. Wt Readings from Last 3 Encounters:  02/26/18 173 lb 8 oz (78.7 kg)  02/25/17 180 lb 4 oz (81.8 kg)  02/23/16 179 lb (81.2 kg)   BP Readings from Last 3 Encounters:  02/26/18 120/70  02/25/17 130/80  02/23/16 102/76   Guideline developer:  UpToDate (see UpToDate for funding source) Date Released: June 2014  Health Maintenance Due  Topic Date Due  . INFLUENZA VACCINE  08/09/2018    There are no preventive care reminders to display for this patient.  Lab Results  Component Value Date   TSH 2.49 02/16/2016   Lab Results  Component Value Date   WBC 6.0 02/25/2017   HGB 15.1 02/25/2017   HCT 45.1 02/25/2017   MCV 97.6 02/25/2017   PLT 366.0 02/25/2017   Lab Results  Component Value Date   NA 131 (L) 02/26/2018   K 4.6 02/26/2018   CO2 28 02/26/2018   GLUCOSE 81 02/26/2018   BUN 15 02/26/2018   CREATININE 0.82 02/26/2018   BILITOT 0.6 02/25/2017   ALKPHOS 67 02/25/2017   AST 18 02/25/2017   ALT 20 02/25/2017   PROT 7.0 02/25/2017   ALBUMIN 4.6 02/25/2017   CALCIUM 8.7 02/26/2018   GFR 93.15 02/26/2018   Lab Results  Component Value Date   CHOL 151 02/25/2017   Lab Results  Component Value Date   HDL 55.20 02/25/2017   Lab Results  Component Value Date   LDLCALC 81 02/25/2017   Lab Results  Component Value Date   TRIG 71.0 02/25/2017   Lab Results  Component Value Date   CHOLHDL 3 02/25/2017   No results found for: HGBA1C    Assessment & Plan:   Problem List Items Addressed This Visit      Other   COVID-19 - Primary      No orders of the defined types were placed in this encounter.   Follow-up: Return in about 1 week (around 12/17/2018), or by video.   Patient will have a Covid test.  We discussed several options.  He will continue to rest and hydrate.  Continue Tylenol.  Discussed quarantining  at home recommendations with him.  He will follow-up in 1 week if he does not continue to improve on his own Interactive video and audio telecommunications were attempted between myself and the patient. However they failed due to the patient having technical difficulties or not having access to video capability.  We continued and completed with audio only.Interactive video and audio telecommunications were attempted between myself and the patient. However they failed due to the patient having technical difficulties or not having access to video capability. We continued and completed with audio only.

## 2018-12-13 LAB — NOVEL CORONAVIRUS, NAA: SARS-CoV-2, NAA: NOT DETECTED

## 2018-12-15 ENCOUNTER — Telehealth: Payer: Self-pay | Admitting: Family Medicine

## 2018-12-15 NOTE — Telephone Encounter (Signed)
Pt is aware covid 19 test is negative on 12/15/2018

## 2018-12-19 DIAGNOSIS — M542 Cervicalgia: Secondary | ICD-10-CM | POA: Diagnosis not present

## 2018-12-24 ENCOUNTER — Telehealth: Payer: Self-pay | Admitting: Family Medicine

## 2018-12-24 DIAGNOSIS — F528 Other sexual dysfunction not due to a substance or known physiological condition: Secondary | ICD-10-CM

## 2018-12-24 MED ORDER — SILDENAFIL CITRATE 20 MG PO TABS
ORAL_TABLET | ORAL | 10 refills | Status: DC
Start: 2018-12-24 — End: 2020-08-29

## 2018-12-24 MED ORDER — SILDENAFIL CITRATE 20 MG PO TABS: ORAL_TABLET | ORAL | 10 refills | Status: DC

## 2018-12-24 NOTE — Telephone Encounter (Signed)
Patient is requesting a TOC from Dr. Ethelene Hal to Dr. Zigmund Daniel.

## 2018-12-24 NOTE — Telephone Encounter (Signed)
Patient voiced that he would like to transfer his care to Dr. Bryan Lemma. Please advise. Thanks.

## 2018-12-24 NOTE — Telephone Encounter (Signed)
Called pt and he states he wants his refill sent to [facility] instead. Called [facility] and canceled rx and re-sent to [facility] per pt's request.

## 2018-12-24 NOTE — Telephone Encounter (Signed)
I am currently inundated with new patients and unfortunately am not able to accept pt at this time

## 2018-12-24 NOTE — Addendum Note (Signed)
Addended by: Benson Setting L on: 12/24/2018 12:53 PM   Modules accepted: Orders

## 2018-12-24 NOTE — Telephone Encounter (Signed)
Patient came into in office and requested a refill on sildenafil (REVATIO) 20 MG tablet. Please contact patient once refill has been sent in to pharmacy.

## 2018-12-25 NOTE — Telephone Encounter (Signed)
Per patient, he has decided to reach back out to [facility] and inquire about seeing a provider there, since it's not to far from his home.

## 2018-12-25 NOTE — Telephone Encounter (Signed)
Mary, I thought that I was already seeing the patient. I will say that on our first meeting; he raked me over the [facility] for being 15 minutes late.

## 2019-01-13 DIAGNOSIS — M542 Cervicalgia: Secondary | ICD-10-CM | POA: Diagnosis not present

## 2019-01-15 DIAGNOSIS — M542 Cervicalgia: Secondary | ICD-10-CM | POA: Diagnosis not present

## 2019-01-23 DIAGNOSIS — M542 Cervicalgia: Secondary | ICD-10-CM | POA: Diagnosis not present

## 2019-01-23 DIAGNOSIS — M5412 Radiculopathy, cervical region: Secondary | ICD-10-CM | POA: Diagnosis not present

## 2019-01-23 DIAGNOSIS — G5603 Carpal tunnel syndrome, bilateral upper limbs: Secondary | ICD-10-CM | POA: Diagnosis not present

## 2019-01-23 DIAGNOSIS — M4802 Spinal stenosis, cervical region: Secondary | ICD-10-CM | POA: Diagnosis not present

## 2019-02-04 DIAGNOSIS — M5412 Radiculopathy, cervical region: Secondary | ICD-10-CM | POA: Diagnosis not present

## 2019-02-19 DIAGNOSIS — M542 Cervicalgia: Secondary | ICD-10-CM | POA: Diagnosis not present

## 2019-02-19 DIAGNOSIS — M5440 Lumbago with sciatica, unspecified side: Secondary | ICD-10-CM | POA: Diagnosis not present

## 2019-02-28 ENCOUNTER — Ambulatory Visit: Payer: PPO | Attending: Internal Medicine

## 2019-02-28 DIAGNOSIS — Z23 Encounter for immunization: Secondary | ICD-10-CM | POA: Insufficient documentation

## 2019-02-28 NOTE — Progress Notes (Signed)
   Covid-19 Vaccination Clinic  Name:  Todd Carpenter    MRN: [id number] DOB: 1950/12/08  02/28/2019  Todd Carpenter was observed post Covid-19 immunization for 15 minutes without incidence. He was provided with Vaccine Information Sheet and instruction to access the V-Safe system.   Todd Carpenter was instructed to call 911 with any severe reactions post vaccine: Marland Kitchen Difficulty breathing  . Swelling of your face and throat  . A fast heartbeat  . A bad rash all over your body  . Dizziness and weakness    Immunizations Administered    Name Date Dose VIS Date Route   Pfizer COVID-19 Vaccine 02/28/2019 11:46 AM 0.3 mL 12/19/2018 Intramuscular   Manufacturer: [place]   Lot: [id number]   [place]: [id number]

## 2019-03-17 ENCOUNTER — Telehealth: Payer: Self-pay | Admitting: Family Medicine

## 2019-03-17 NOTE — Telephone Encounter (Signed)
LVM to schedule AWV with Health Nurse Advisor  

## 2019-03-24 ENCOUNTER — Ambulatory Visit: Payer: PPO | Attending: Internal Medicine

## 2019-03-24 DIAGNOSIS — Z23 Encounter for immunization: Secondary | ICD-10-CM

## 2019-03-24 NOTE — Progress Notes (Signed)
   Covid-19 Vaccination Clinic  Name:  Todd Carpenter    MRN: [id number] DOB: 21-Aug-1950  03/24/2019  Mr. Todd Carpenter was observed post Covid-19 immunization for 15 minutes without incident. He was provided with Vaccine Information Sheet and instruction to access the V-Safe system.   Mr. Todd Carpenter was instructed to call 911 with any severe reactions post vaccine: Marland Kitchen Difficulty breathing  . Swelling of face and throat  . A fast heartbeat  . A bad rash all over body  . Dizziness and weakness   Immunizations Administered    Name Date Dose VIS Date Route   Pfizer COVID-19 Vaccine 03/24/2019 11:33 AM 0.3 mL 12/19/2018 Intramuscular   Manufacturer: [place]   Lot: [id number]   [place]: [id number]

## 2019-05-19 DIAGNOSIS — M5440 Lumbago with sciatica, unspecified side: Secondary | ICD-10-CM | POA: Diagnosis not present

## 2019-05-19 DIAGNOSIS — M542 Cervicalgia: Secondary | ICD-10-CM | POA: Diagnosis not present

## 2019-06-02 DIAGNOSIS — M5127 Other intervertebral disc displacement, lumbosacral region: Secondary | ICD-10-CM | POA: Diagnosis not present

## 2019-06-02 DIAGNOSIS — M5440 Lumbago with sciatica, unspecified side: Secondary | ICD-10-CM | POA: Diagnosis not present

## 2019-06-04 DIAGNOSIS — M542 Cervicalgia: Secondary | ICD-10-CM | POA: Diagnosis not present

## 2019-06-04 DIAGNOSIS — M5440 Lumbago with sciatica, unspecified side: Secondary | ICD-10-CM | POA: Diagnosis not present

## 2019-06-04 DIAGNOSIS — G5603 Carpal tunnel syndrome, bilateral upper limbs: Secondary | ICD-10-CM | POA: Diagnosis not present

## 2019-07-27 ENCOUNTER — Ambulatory Visit (INDEPENDENT_AMBULATORY_CARE_PROVIDER_SITE_OTHER): Payer: PPO | Admitting: Family Medicine

## 2019-07-27 ENCOUNTER — Encounter: Payer: Self-pay | Admitting: Family Medicine

## 2019-07-27 ENCOUNTER — Other Ambulatory Visit: Payer: Self-pay

## 2019-07-27 ENCOUNTER — Ambulatory Visit (INDEPENDENT_AMBULATORY_CARE_PROVIDER_SITE_OTHER): Payer: PPO

## 2019-07-27 VITALS — BP 120/78 | HR 79 | Temp 97.3°F | Ht 72.0 in | Wt 174.2 lb

## 2019-07-27 DIAGNOSIS — W57XXXA Bitten or stung by nonvenomous insect and other nonvenomous arthropods, initial encounter: Secondary | ICD-10-CM | POA: Diagnosis not present

## 2019-07-27 DIAGNOSIS — M25571 Pain in right ankle and joints of right foot: Secondary | ICD-10-CM

## 2019-07-27 DIAGNOSIS — S91051A Open bite, right ankle, initial encounter: Secondary | ICD-10-CM | POA: Diagnosis not present

## 2019-07-27 DIAGNOSIS — M19071 Primary osteoarthritis, right ankle and foot: Secondary | ICD-10-CM | POA: Diagnosis not present

## 2019-07-27 DIAGNOSIS — M7989 Other specified soft tissue disorders: Secondary | ICD-10-CM | POA: Diagnosis not present

## 2019-07-27 MED ORDER — DOXYCYCLINE HYCLATE 100 MG PO TABS: 100.0000 mg | ORAL_TABLET | Freq: Two times a day (BID) | ORAL | 0 refills | Status: DC

## 2019-07-27 NOTE — Progress Notes (Signed)
Established Patient Office Visit  Subjective:  Patient ID: Todd Carpenter, male    DOB: 1950-04-30  Age: 69 y.o. MRN: [id number]  CC:  Chief Complaint  Patient presents with  . Acute Visit    tick bite on right foot x 10 days now foot and ankle is swollen some bruising on toes very painful.     HPI Todd Carpenter presents for evaluation of right ankle pain status post tick bite to the ankle 1 week prior.  Over the last 3 days ankle became swollen and painful.  He was playing golf and had to stop.  Denies past history of injury to his ankle.  No history of weakness or instability.  Did have a slight headache but denies fevers chills rash nausea or vomiting.  No treatment today.  Has been playing golf since age 76.  Past Medical History:  Diagnosis Date  . BENIGN PROSTATIC HYPERTROPHY 09/18/2006  . Degenerative disc disease   . PSORIASIS 07/14/2007    Past Surgical History:  Procedure Laterality Date  . BACK SURGERY  [phone]  . NECK SURGERY  03/16/2010    Family History  Problem Relation Age of Onset  . Cervical cancer Other   . Cancer Other        prostate cancer  . Ulcers Other   . Heart disease Other   . Heart disease Sister   . Heart disease Brother   . Colon cancer Neg Hx     Social History   Socioeconomic History  . Marital status: Married    Spouse name: Not on file  . Number of children: Not on file  . Years of education: Not on file  . Highest education level: Not on file  Occupational History  . Not on file  Tobacco Use  . Smoking status: Former Research scientist (life sciences)  . Smokeless tobacco: Never Used  Substance and Sexual Activity  . Alcohol use: Yes    Alcohol/week: 0.0 standard drinks    Comment: 3-4 times per year,per patient.   . Drug use: No  . Sexual activity: Not on file  Other Topics Concern  . Not on file  Social History Narrative  . Not on file   Social Determinants of Health   Financial Resource Strain:   . Difficulty of Paying Living Expenses:   Food  Insecurity:   . Worried About Charity fundraiser in the Last Year:   . Arboriculturist in the Last Year:   Transportation Needs:   . Film/video editor (Medical):   Marland Kitchen Lack of Transportation (Non-Medical):   Physical Activity:   . Days of Exercise per Week:   . Minutes of Exercise per Session:   Stress:   . Feeling of Stress :   Social Connections:   . Frequency of Communication with Friends and Family:   . Frequency of Social Gatherings with Friends and Family:   . Attends Religious Services:   . Active Member of Clubs or Organizations:   . Attends Archivist Meetings:   Marland Kitchen Marital Status:   Intimate Partner Violence:   . Fear of Current or Ex-Partner:   . Emotionally Abused:   Marland Kitchen Physically Abused:   . Sexually Abused:     Outpatient Medications Prior to Visit  Medication Sig Dispense Refill  . fluocinonide cream (LIDEX) 0.05 % Apply topically 2 (two) times daily.    . sildenafil (REVATIO) 20 MG tablet Use as directed 10 tablet 10  .  Carbamide Peroxide-Saline (EAR WAX CLEANSING) 6.5 % KIT Follow kit instructions (Patient not taking: Reported on 07/27/2019) 1 kit 1   No facility-administered medications prior to visit.    No Known Allergies  ROS Review of Systems  Constitutional: Negative.   HENT: Negative.   Eyes: Negative for photophobia.  Respiratory: Negative.   Cardiovascular: Negative.   Gastrointestinal: Negative.   Endocrine: Negative for polyphagia and polyuria.  Genitourinary: Negative.   Musculoskeletal: Positive for arthralgias and gait problem.  Skin: Negative for rash.  Neurological: Negative for dizziness, light-headedness, numbness and headaches.  Hematological: Does not bruise/bleed easily.  Psychiatric/Behavioral: Negative.       Objective:    Physical Exam Vitals and nursing note reviewed.  Constitutional:      General: He is not in acute distress.    Appearance: Normal appearance. He is normal weight. He is not ill-appearing  or toxic-appearing.  HENT:     Head: Normocephalic and atraumatic.     Right Ear: External ear normal.     Left Ear: External ear normal.  Eyes:     General: No scleral icterus.       Right eye: No discharge.        Left eye: No discharge.     Extraocular Movements: Extraocular movements intact.     Conjunctiva/sclera: Conjunctivae normal.     Pupils: Pupils are equal, round, and reactive to light.  Pulmonary:     Effort: Pulmonary effort is normal.  Musculoskeletal:     Right ankle: Swelling present. No deformity, ecchymosis or lacerations. No tenderness. Decreased range of motion. Anterior drawer test negative. Normal pulse.     Right Achilles Tendon: No tenderness or defects. Thompson's test negative.       Legs:  Skin:    General: Skin is warm and dry.  Neurological:     General: No focal deficit present.     Mental Status: He is alert and oriented to person, place, and time.  Psychiatric:        Mood and Affect: Mood normal.        Behavior: Behavior normal.     BP 120/78   Pulse 79   Temp (!) 97.3 F (36.3 C) (Tympanic)   Ht 6' (1.829 m)   Wt 174 lb 3.2 oz (79 kg)   SpO2 98%   BMI 23.63 kg/m  Wt Readings from Last 3 Encounters:  07/27/19 174 lb 3.2 oz (79 kg)  02/26/18 173 lb 8 oz (78.7 kg)  02/25/17 180 lb 4 oz (81.8 kg)     Health Maintenance Due  Topic Date Due  . TETANUS/TDAP  07/13/2017    There are no preventive care reminders to display for this patient.  Lab Results  Component Value Date   TSH 2.49 02/16/2016   Lab Results  Component Value Date   WBC 6.0 02/25/2017   HGB 15.1 02/25/2017   HCT 45.1 02/25/2017   MCV 97.6 02/25/2017   PLT 366.0 02/25/2017   Lab Results  Component Value Date   NA 131 (L) 02/26/2018   K 4.6 02/26/2018   CO2 28 02/26/2018   GLUCOSE 81 02/26/2018   BUN 15 02/26/2018   CREATININE 0.82 02/26/2018   BILITOT 0.6 02/25/2017   ALKPHOS 67 02/25/2017   AST 18 02/25/2017   ALT 20 02/25/2017   PROT 7.0  02/25/2017   ALBUMIN 4.6 02/25/2017   CALCIUM 8.7 02/26/2018   GFR 93.15 02/26/2018   Lab Results  Component Value Date  CHOL 151 02/25/2017   Lab Results  Component Value Date   HDL 55.20 02/25/2017   Lab Results  Component Value Date   LDLCALC 81 02/25/2017   Lab Results  Component Value Date   TRIG 71.0 02/25/2017   Lab Results  Component Value Date   CHOLHDL 3 02/25/2017   No results found for: HGBA1C    Assessment & Plan:   Problem List Items Addressed This Visit      Musculoskeletal and Integument   Tick bite   Relevant Medications   doxycycline (VIBRA-TABS) 100 MG tablet     Other   Acute right ankle pain - Primary   Relevant Orders   DG Ankle Complete Right      Meds ordered this encounter  Medications  . doxycycline (VIBRA-TABS) 100 MG tablet    Sig: Take 1 tablet (100 mg total) by mouth 2 (two) times daily.    Dispense:  20 tablet    Refill:  0    Follow-up: Return if symptoms worsen or fail to improve.   Given information on tick bites.  Sun precautions were given regarding doxycycline. Libby Maw, MD

## 2019-07-27 NOTE — Patient Instructions (Signed)

## 2019-07-31 ENCOUNTER — Telehealth: Payer: Self-pay | Admitting: Family Medicine

## 2019-07-31 NOTE — Telephone Encounter (Signed)
Patient would like call with results from his recent ankle Xray.

## 2019-08-04 ENCOUNTER — Other Ambulatory Visit: Payer: Self-pay

## 2019-08-04 NOTE — Telephone Encounter (Signed)
Spoke with patient who verbally understood x-ray was okay just showed arthritis.

## 2019-08-04 NOTE — Telephone Encounter (Signed)
Patient is calling back to get X-ray results.  He wants to know if someone else can review them and give him a call back today with the results. He is aware that provider is out of office this week. Please give him a call back at [phone].  Thank you

## 2019-08-28 ENCOUNTER — Ambulatory Visit (INDEPENDENT_AMBULATORY_CARE_PROVIDER_SITE_OTHER): Payer: PPO | Admitting: Family Medicine

## 2019-08-28 ENCOUNTER — Encounter: Payer: Self-pay | Admitting: Family Medicine

## 2019-08-28 ENCOUNTER — Other Ambulatory Visit: Payer: Self-pay

## 2019-08-28 VITALS — BP 115/76 | HR 79 | Temp 97.9°F | Ht 70.0 in | Wt 169.0 lb

## 2019-08-28 DIAGNOSIS — Z0000 Encounter for general adult medical examination without abnormal findings: Secondary | ICD-10-CM

## 2019-08-28 DIAGNOSIS — H6123 Impacted cerumen, bilateral: Secondary | ICD-10-CM | POA: Diagnosis not present

## 2019-08-28 DIAGNOSIS — M545 Low back pain, unspecified: Secondary | ICD-10-CM

## 2019-08-28 DIAGNOSIS — L409 Psoriasis, unspecified: Secondary | ICD-10-CM | POA: Diagnosis not present

## 2019-08-28 DIAGNOSIS — M25532 Pain in left wrist: Secondary | ICD-10-CM | POA: Diagnosis not present

## 2019-08-28 MED ORDER — METHOCARBAMOL 500 MG PO TABS: 500.0000 mg | ORAL_TABLET | Freq: Three times a day (TID) | ORAL | 0 refills | Status: DC | PRN

## 2019-08-28 MED ORDER — DICLOFENAC SODIUM 1 % EX GEL: CUTANEOUS | 0 refills | Status: DC

## 2019-08-28 MED ORDER — DEBROX 6.5 % OT SOLN: 5.0000 [drp] | Freq: Two times a day (BID) | OTIC | 0 refills | Status: DC

## 2019-08-28 NOTE — Patient Instructions (Addendum)
Earwax Buildup, Adult The ears produce a substance called earwax that helps keep bacteria out of the ear and protects the skin in the ear canal. Occasionally, earwax can build up in the ear and cause discomfort or hearing loss. What increases the risk? This condition is more likely to develop in people who:  Are male.  Are elderly.  Naturally produce more earwax.  Clean their ears often with cotton swabs.  Use earplugs often.  Use in-ear headphones often.  Wear hearing aids.  Have narrow ear canals.  Have earwax that is overly thick or sticky.  Have eczema.  Are dehydrated.  Have excess hair in the ear canal. What are the signs or symptoms? Symptoms of this condition include:  Reduced or muffled hearing.  A feeling of fullness in the ear or feeling that the ear is plugged.  Fluid coming from the ear.  Ear pain.  Ear itch.  Ringing in the ear.  Coughing.  An obvious piece of earwax that can be seen inside the ear canal. How is this diagnosed? This condition may be diagnosed based on:  Your symptoms.  Your medical history.  An ear exam. During the exam, your health care provider will look into your ear with an instrument called an otoscope. You may have tests, including a hearing test. How is this treated? This condition may be treated by:  Using ear drops to soften the earwax.  Having the earwax removed by a health care provider. The health care provider may: ? Flush the ear with water. ? Use an instrument that has a loop on the end (curette). ? Use a suction device.  Surgery to remove the wax buildup. This may be done in severe cases. Follow these instructions at home:   Take over-the-counter and prescription medicines only as told by your health care provider.  Do not put any objects, including cotton swabs, into your ear. You can clean the opening of your ear canal with a washcloth or facial tissue.  Follow instructions from your health care  provider about cleaning your ears. Do not over-clean your ears.  Drink enough fluid to keep your urine clear or pale yellow. This will help to thin the earwax.  Keep all follow-up visits as told by your health care provider. If earwax builds up in your ears often or if you use hearing aids, consider seeing your health care provider for routine, preventive ear cleanings. Ask your health care provider how often you should schedule your cleanings.  If you have hearing aids, clean them according to instructions from the manufacturer and your health care provider. Contact a health care provider if:  You have ear pain.  You develop a fever.  You have blood, pus, or other fluid coming from your ear.  You have hearing loss.  You have ringing in your ears that does not go away.  Your symptoms do not improve with treatment.  You feel like the room is spinning (vertigo). Summary  Earwax can build up in the ear and cause discomfort or hearing loss.  The most common symptoms of this condition include reduced or muffled hearing and a feeling of fullness in the ear or feeling that the ear is plugged.  This condition may be diagnosed based on your symptoms, your medical history, and an ear exam.  This condition may be treated by using ear drops to soften the earwax or by having the earwax removed by a health care provider.  Do not put any  objects, including cotton swabs, into your ear. You can clean the opening of your ear canal with a washcloth or facial tissue. This information is not intended to replace advice given to you by your health care provider. Make sure you discuss any questions you have with your health care provider. Document Revised: 12/07/2016 Document Reviewed: 03/07/2016 Elsevier Patient Education  2020 [place] 65 Years and Older, Male Preventive care refers to lifestyle choices and visits with your health care provider that can promote health and  wellness. This includes:  A yearly physical exam. This is also called an annual well check.  Regular dental and eye exams.  Immunizations.  Screening for certain conditions.  Healthy lifestyle choices, such as diet and exercise. What can I expect for my preventive care visit? Physical exam Your health care provider will check:  Height and weight. These may be used to calculate body mass index (BMI), which is a measurement that tells if you are at a healthy weight.  Heart rate and blood pressure.  Your skin for abnormal spots. Counseling Your health care provider may ask you questions about:  Alcohol, tobacco, and drug use.  Emotional well-being.  Home and relationship well-being.  Sexual activity.  Eating habits.  History of falls.  Memory and ability to understand (cognition).  Work and work Statistician. What immunizations do I need?  Influenza (flu) vaccine  This is recommended every year. Tetanus, diphtheria, and pertussis (Tdap) vaccine  You may need a Td booster every 10 years. Varicella (chickenpox) vaccine  You may need this vaccine if you have not already been vaccinated. Zoster (shingles) vaccine  You may need this after age 31. Pneumococcal conjugate (PCV13) vaccine  One dose is recommended after age 84. Pneumococcal polysaccharide (PPSV23) vaccine  One dose is recommended after age 81. Measles, mumps, and rubella (MMR) vaccine  You may need at least one dose of MMR if you were born in 1957 or later. You may also need a second dose. Meningococcal conjugate (MenACWY) vaccine  You may need this if you have certain conditions. Hepatitis A vaccine  You may need this if you have certain conditions or if you travel or work in places where you may be exposed to hepatitis A. Hepatitis B vaccine  You may need this if you have certain conditions or if you travel or work in places where you may be exposed to hepatitis B. Haemophilus influenzae type  b (Hib) vaccine  You may need this if you have certain conditions. You may receive vaccines as individual doses or as more than one vaccine together in one shot (combination vaccines). Talk with your health care provider about the risks and benefits of combination vaccines. What tests do I need? Blood tests  Lipid and cholesterol levels. These may be checked every 5 years, or more frequently depending on your overall health.  Hepatitis C test.  Hepatitis B test. Screening  Lung cancer screening. You may have this screening every year starting at age 63 if you have a 30-pack-year history of smoking and currently smoke or have quit within the past 15 years.  Colorectal cancer screening. All adults should have this screening starting at age 32 and continuing until age 54. Your health care provider may recommend screening at age 88 if you are at increased risk. You will have tests every 1-10 years, depending on your results and the type of screening test.  Prostate cancer screening. Recommendations will vary depending on your family history  and other risks.  Diabetes screening. This is done by checking your blood sugar (glucose) after you have not eaten for a while (fasting). You may have this done every 1-3 years.  Abdominal aortic aneurysm (AAA) screening. You may need this if you are a current or former smoker.  Sexually transmitted disease (STD) testing. Follow these instructions at home: Eating and drinking  Eat a diet that includes fresh fruits and vegetables, whole grains, lean protein, and low-fat dairy products. Limit your intake of foods with high amounts of sugar, saturated fats, and salt.  Take vitamin and mineral supplements as recommended by your health care provider.  Do not drink alcohol if your health care provider tells you not to drink.  If you drink alcohol: ? Limit how much you have to 0-2 drinks a day. ? Be aware of how much alcohol is in your drink. In the [place]  one drink equals one 12 oz bottle of beer (355 mL), one 5 oz glass of wine (148 mL), or one 1 oz glass of hard liquor (44 mL). Lifestyle  Take daily care of your teeth and gums.  Stay active. Exercise for at least 30 minutes on 5 or more days each week.  Do not use any products that contain nicotine or tobacco, such as cigarettes, e-cigarettes, and chewing tobacco. If you need help quitting, ask your health care provider.  If you are sexually active, practice safe sex. Use a condom or other form of protection to prevent STIs (sexually transmitted infections).  Talk with your health care provider about taking a low-dose aspirin or statin. What's next?  Visit your health care provider once a year for a well check visit.  Ask your health care provider how often you should have your eyes and teeth checked.  Stay up to date on all vaccines. This information is not intended to replace advice given to you by your health care provider. Make sure you discuss any questions you have with your health care provider. Document Revised: 12/19/2017 Document Reviewed: 12/19/2017 Elsevier Patient Education  [place].  Psoriasis Psoriasis is a long-term (chronic) skin condition. It occurs because your body's defense system (immune system) causes skin cells to form too quickly. This causes raised, red patches (plaques) on your skin that look silvery. The patches may be on all areas of your body. They can be any size or shape. Psoriasis can come and go. It can range from mild to very bad. It cannot be passed from one person to another (is not contagious). There is no cure for this condition, but it can be helped with treatment. What are the causes? The cause of psoriasis is not known. Some things can make it worse. These are:  Skin damage, such as cuts, scrapes, sunburn, and dryness.  Not getting enough sunlight.  Some medicines.  Alcohol.  Tobacco.  Stress.  Infections. What increases  the risk?  Having a family member with psoriasis.  Being very overweight (obese).  Being 22-15 years old.  Taking certain medicines. What are the signs or symptoms? There are different types of psoriasis. The types are:  Plaque. This is the most common. Symptoms include red, raised patches with a silvery coating. These may be itchy. Your nails may be crumbly or fall off.  Guttate. Symptoms include small red spots on your stomach area, arms, and legs. These may happen after you have been sick, such as with strep throat.  Inverse. Symptoms include patches in your armpits, under  your breasts, private areas, or on your butt.  Pustular. Symptoms include pus-filled bumps on the palms of your hands or the soles of your feet. You also may feel very tired, weak, have a fever, and not be hungry.  Erythrodermic. Symptoms include bright red skin that looks burned. You may have a fast heartbeat and a body temperature that is too high or too low. You may be itchy or in pain.  Sebopsoriasis. Symptoms include red patches on your scalp, forehead, and face that are greasy.  Psoriatic arthritis. Symptoms include swollen, painful joints along with scaly skin patches. How is this treated? There is no cure for this condition, but treatment can:  Help your skin heal.  Lessen itching and irritation and swelling (inflammation).  Slow the growth of new skin cells.  Help your body's defense system respond better to your skin. Treatment may include:  Creams or ointments.  Light therapy. This may include natural sunlight or light therapy in a doctor's office.  Medicines. These can help your body better manage skin cells. They may be used with light therapy or ointments. Medicines may include pills or injections. You may also get antibiotic medicines if you have an infection. Follow these instructions at home: Skin Care  Apply lotion to your skin as needed. Only use those that your doctor has said are  okay.  Apply cool, wet cloths (cold compresses) to the affected areas.  Do not use a hot tub or take hot showers. Use slightly warm, not hot, water when taking showers and baths.  Do not scratch your skin. Lifestyle   Do not use any products that contain nicotine or tobacco, such as cigarettes, e-cigarettes, and chewing tobacco. If you need help quitting, ask your doctor.  Lower your stress.  Keep a healthy weight.  Go out in the sun as told by your doctor. Do not get sunburned.  Join a support group. Medicines  Take or use over-the-counter and prescription medicines only as told by your doctor.  If you were prescribed an antibiotic medicine, take it as told by your doctor. Do not stop using the antibiotic even if you start to feel better. Alcohol use If you drink alcohol:  Limit how much you use: ? 0-1 drink a day for women. ? 0-2 drinks a day for men.  Be aware of how much alcohol is in your drink. In the [place] one drink equals one 12 oz bottle of beer (355 mL), one 5 oz glass of wine (148 mL), or one 1 oz glass of hard liquor (44 mL). General instructions  Keep a journal to track the things that cause symptoms (triggers). Try to avoid these things.  See a counselor if you feel the support would help.  Keep all follow-up visits as told by your doctor. This is important. Contact a doctor if:  You have a fever.  Your pain gets worse.  You have more redness or warmth in the affected areas.  You have new or worse pain or stiffness in your joints.  Your nails start to break easily or pull away from the nail bed.  You feel very sad (depressed). Summary  Psoriasis is a long-term (chronic) skin condition.  There is no cure for this condition, but treatment can help manage it.  Keep a journal to track the things that cause symptoms.  Take or use over-the-counter and prescription medicines only as told by your doctor.  Keep all follow-up visits as told by your  doctor.  This is important. This information is not intended to replace advice given to you by your health care provider. Make sure you discuss any questions you have with your health care provider. Document Revised: 10/29/2017 Document Reviewed: 10/29/2017 Elsevier Patient Education  2020 [place] 65 Years and Older, Male Preventive care refers to lifestyle choices and visits with your health care provider that can promote health and wellness. This includes:  A yearly physical exam. This is also called an annual well check.  Regular dental and eye exams.  Immunizations.  Screening for certain conditions.  Healthy lifestyle choices, such as diet and exercise. What can I expect for my preventive care visit? Physical exam Your health care provider will check:  Height and weight. These may be used to calculate body mass index (BMI), which is a measurement that tells if you are at a healthy weight.  Heart rate and blood pressure.  Your skin for abnormal spots. Counseling Your health care provider may ask you questions about:  Alcohol, tobacco, and drug use.  Emotional well-being.  Home and relationship well-being.  Sexual activity.  Eating habits.  History of falls.  Memory and ability to understand (cognition).  Work and work Statistician. What immunizations do I need?  Influenza (flu) vaccine  This is recommended every year. Tetanus, diphtheria, and pertussis (Tdap) vaccine  You may need a Td booster every 10 years. Varicella (chickenpox) vaccine  You may need this vaccine if you have not already been vaccinated. Zoster (shingles) vaccine  You may need this after age 40. Pneumococcal conjugate (PCV13) vaccine  One dose is recommended after age 29. Pneumococcal polysaccharide (PPSV23) vaccine  One dose is recommended after age 83. Measles, mumps, and rubella (MMR) vaccine  You may need at least one dose of MMR if you were born in  1958 or later. You may also need a second dose. Meningococcal conjugate (MenACWY) vaccine  You may need this if you have certain conditions. Hepatitis A vaccine  You may need this if you have certain conditions or if you travel or work in places where you may be exposed to hepatitis A. Hepatitis B vaccine  You may need this if you have certain conditions or if you travel or work in places where you may be exposed to hepatitis B. Haemophilus influenzae type b (Hib) vaccine  You may need this if you have certain conditions. You may receive vaccines as individual doses or as more than one vaccine together in one shot (combination vaccines). Talk with your health care provider about the risks and benefits of combination vaccines. What tests do I need? Blood tests  Lipid and cholesterol levels. These may be checked every 5 years, or more frequently depending on your overall health.  Hepatitis C test.  Hepatitis B test. Screening  Lung cancer screening. You may have this screening every year starting at age 66 if you have a 30-pack-year history of smoking and currently smoke or have quit within the past 15 years.  Colorectal cancer screening. All adults should have this screening starting at age 64 and continuing until age 72. Your health care provider may recommend screening at age 16 if you are at increased risk. You will have tests every 1-10 years, depending on your results and the type of screening test.  Prostate cancer screening. Recommendations will vary depending on your family history and other risks.  Diabetes screening. This is done by checking your blood sugar (glucose) after you have not eaten  for a while (fasting). You may have this done every 1-3 years.  Abdominal aortic aneurysm (AAA) screening. You may need this if you are a current or former smoker.  Sexually transmitted disease (STD) testing. Follow these instructions at home: Eating and drinking  Eat a diet that  includes fresh fruits and vegetables, whole grains, lean protein, and low-fat dairy products. Limit your intake of foods with high amounts of sugar, saturated fats, and salt.  Take vitamin and mineral supplements as recommended by your health care provider.  Do not drink alcohol if your health care provider tells you not to drink.  If you drink alcohol: ? Limit how much you have to 0-2 drinks a day. ? Be aware of how much alcohol is in your drink. In the [place] one drink equals one 12 oz bottle of beer (355 mL), one 5 oz glass of wine (148 mL), or one 1 oz glass of hard liquor (44 mL). Lifestyle  Take daily care of your teeth and gums.  Stay active. Exercise for at least 30 minutes on 5 or more days each week.  Do not use any products that contain nicotine or tobacco, such as cigarettes, e-cigarettes, and chewing tobacco. If you need help quitting, ask your health care provider.  If you are sexually active, practice safe sex. Use a condom or other form of protection to prevent STIs (sexually transmitted infections).  Talk with your health care provider about taking a low-dose aspirin or statin. What's next?  Visit your health care provider once a year for a well check visit.  Ask your health care provider how often you should have your eyes and teeth checked.  Stay up to date on all vaccines. This information is not intended to replace advice given to you by your health care provider. Make sure you discuss any questions you have with your health care provider. Document Revised: 12/19/2017 Document Reviewed: 12/19/2017 Elsevier Patient Education  2020 [employer].

## 2019-08-28 NOTE — Progress Notes (Signed)
Established Patient Office Visit  Subjective:  Patient ID: Todd Carpenter, male    DOB: 06-04-1950  Age: 69 y.o. MRN: [id number]  CC:  Chief Complaint  Patient presents with  . Annual Exam    CPE, concerns about pain on right side of hip and back. Arthritis in left wrist  . Arthritis    HPI Todd KARCZEWSKI presents for a physical exam and follow-up of some medical issues.  Injured his lower back while playing golf with an awkward lie.  Complains of left lower back pain that is nonradiating.  History of L4-L5 fusion in 2012.  Has been able to play since that time.  There is no radiation pain into his leg.  Complains of left wrist pain.  Denies injury.  He is active.  He does not smoke drink or use illicit drugs.  History of BPH urine flow is okay.  Status post colonoscopy 5 years ago and was advised to come back in 10 years.  Past Medical History:  Diagnosis Date  . BENIGN PROSTATIC HYPERTROPHY 09/18/2006  . Degenerative disc disease   . PSORIASIS 07/14/2007    Past Surgical History:  Procedure Laterality Date  . BACK SURGERY  [phone]  . NECK SURGERY  03/16/2010    Family History  Problem Relation Age of Onset  . Cervical cancer Other   . Cancer Other        prostate cancer  . Ulcers Other   . Heart disease Other   . Heart disease Sister   . Heart disease Brother   . Colon cancer Neg Hx     Social History   Socioeconomic History  . Marital status: Married    Spouse name: Not on file  . Number of children: Not on file  . Years of education: Not on file  . Highest education level: Not on file  Occupational History  . Not on file  Tobacco Use  . Smoking status: Former Research scientist (life sciences)  . Smokeless tobacco: Never Used  Vaping Use  . Vaping Use: Never used  Substance and Sexual Activity  . Alcohol use: Yes    Alcohol/week: 0.0 standard drinks    Comment: 3-4 times per year,per patient.   . Drug use: No  . Sexual activity: Not on file  Other Topics Concern  . Not on file    Social History Narrative  . Not on file   Social Determinants of Health   Financial Resource Strain:   . Difficulty of Paying Living Expenses: Not on file  Food Insecurity:   . Worried About Charity fundraiser in the Last Year: Not on file  . Ran Out of Food in the Last Year: Not on file  Transportation Needs:   . Lack of Transportation (Medical): Not on file  . Lack of Transportation (Non-Medical): Not on file  Physical Activity:   . Days of Exercise per Week: Not on file  . Minutes of Exercise per Session: Not on file  Stress:   . Feeling of Stress : Not on file  Social Connections:   . Frequency of Communication with Friends and Family: Not on file  . Frequency of Social Gatherings with Friends and Family: Not on file  . Attends Religious Services: Not on file  . Active Member of Clubs or Organizations: Not on file  . Attends Archivist Meetings: Not on file  . Marital Status: Not on file  Intimate Partner Violence:   . Fear of Current  or Ex-Partner: Not on file  . Emotionally Abused: Not on file  . Physically Abused: Not on file  . Sexually Abused: Not on file    Outpatient Medications Prior to Visit  Medication Sig Dispense Refill  . fluocinonide cream (LIDEX) 0.05 % Apply topically 2 (two) times daily.    . sildenafil (REVATIO) 20 MG tablet Use as directed 10 tablet 10  . Carbamide Peroxide-Saline (EAR WAX CLEANSING) 6.5 % KIT Follow kit instructions (Patient not taking: Reported on 07/27/2019) 1 kit 1  . doxycycline (VIBRA-TABS) 100 MG tablet Take 1 tablet (100 mg total) by mouth 2 (two) times daily. (Patient not taking: Reported on 08/28/2019) 20 tablet 0   No facility-administered medications prior to visit.    No Known Allergies  ROS Review of Systems  Constitutional: Negative.   HENT: Negative.   Eyes: Negative for photophobia and visual disturbance.  Respiratory: Negative.   Cardiovascular: Negative.   Gastrointestinal: Negative.   Endocrine:  Negative for polyphagia.  Genitourinary: Negative for difficulty urinating, frequency and urgency.  Musculoskeletal: Positive for arthralgias and back pain.  Skin: Positive for rash.  Allergic/Immunologic: Negative for immunocompromised state.  Neurological: Negative for weakness and numbness.  Hematological: Does not bruise/bleed easily.      Objective:    Physical Exam Vitals and nursing note reviewed.  Constitutional:      General: He is not in acute distress.    Appearance: Normal appearance. He is not ill-appearing, toxic-appearing or diaphoretic.  HENT:     Head: Atraumatic.     Right Ear: Tympanic membrane, ear canal and external ear normal.     Left Ear: Tympanic membrane, ear canal and external ear normal.     Mouth/Throat:     Mouth: Mucous membranes are dry.     Pharynx: Oropharynx is clear.  Eyes:     General: No scleral icterus.       Right eye: No discharge.        Left eye: No discharge.     Extraocular Movements: Extraocular movements intact.     Conjunctiva/sclera: Conjunctivae normal.     Pupils: Pupils are equal, round, and reactive to light.  Cardiovascular:     Rate and Rhythm: Normal rate and regular rhythm.     Pulses: Normal pulses.     Heart sounds: Normal heart sounds.  Pulmonary:     Effort: Pulmonary effort is normal.     Breath sounds: Normal breath sounds.  Abdominal:     General: Abdomen is flat. Bowel sounds are normal. There is no distension.     Palpations: Abdomen is soft.     Tenderness: There is no abdominal tenderness. There is no guarding or rebound.     Hernia: No hernia is present.  Genitourinary:    Prostate: Enlarged. Not tender and no nodules present.     Rectum: Guaiac result negative. No mass, tenderness, anal fissure, external hemorrhoid or internal hemorrhoid. Normal anal tone.  Musculoskeletal:     Left wrist: No swelling, deformity or tenderness. Decreased range of motion (some decrease in flexion and extension).      Cervical back: Normal range of motion and neck supple. No rigidity or tenderness.     Lumbar back: No swelling, tenderness or bony tenderness. Decreased range of motion. Negative right straight leg raise test and negative left straight leg raise test.       Back:  Lymphadenopathy:     Cervical: No cervical adenopathy.  Skin:    General:  Skin is warm and dry.  Neurological:     Mental Status: He is alert and oriented to person, place, and time.  Psychiatric:        Mood and Affect: Mood normal.        Behavior: Behavior normal.     BP 115/76   Pulse 79   Temp 97.9 F (36.6 C)   Ht 5' 10" (1.778 m)   Wt 169 lb (76.7 kg)   SpO2 97%   BMI 24.25 kg/m  Wt Readings from Last 3 Encounters:  08/28/19 169 lb (76.7 kg)  07/27/19 174 lb 3.2 oz (79 kg)  02/26/18 173 lb 8 oz (78.7 kg)     Health Maintenance Due  Topic Date Due  . TETANUS/TDAP  07/13/2017  . INFLUENZA VACCINE  08/09/2019    There are no preventive care reminders to display for this patient.  Lab Results  Component Value Date   TSH 2.49 02/16/2016   Lab Results  Component Value Date   WBC 6.0 02/25/2017   HGB 15.1 02/25/2017   HCT 45.1 02/25/2017   MCV 97.6 02/25/2017   PLT 366.0 02/25/2017   Lab Results  Component Value Date   NA 131 (L) 02/26/2018   K 4.6 02/26/2018   CO2 28 02/26/2018   GLUCOSE 81 02/26/2018   BUN 15 02/26/2018   CREATININE 0.82 02/26/2018   BILITOT 0.6 02/25/2017   ALKPHOS 67 02/25/2017   AST 18 02/25/2017   ALT 20 02/25/2017   PROT 7.0 02/25/2017   ALBUMIN 4.6 02/25/2017   CALCIUM 8.7 02/26/2018   GFR 93.15 02/26/2018   Lab Results  Component Value Date   CHOL 151 02/25/2017   Lab Results  Component Value Date   HDL 55.20 02/25/2017   Lab Results  Component Value Date   LDLCALC 81 02/25/2017   Lab Results  Component Value Date   TRIG 71.0 02/25/2017   Lab Results  Component Value Date   CHOLHDL 3 02/25/2017   No results found for: HGBA1C    Assessment &  Plan:   Problem List Items Addressed This Visit      Nervous and Auditory   Excessive cerumen in both ear canals   Relevant Medications   carbamide peroxide (DEBROX) 6.5 % OTIC solution     Musculoskeletal and Integument   Psoriasis   Relevant Orders   Ambulatory referral to Dermatology     Other   Healthcare maintenance - Primary   Relevant Orders   CBC   Comprehensive metabolic panel   LDL cholesterol, direct   Lipid panel   PSA   Urinalysis, Routine w reflex microscopic   Wrist pain, acute, left   Relevant Medications   diclofenac Sodium (VOLTAREN) 1 % GEL   Acute right-sided low back pain without sciatica   Relevant Medications   diclofenac Sodium (VOLTAREN) 1 % GEL   methocarbamol (ROBAXIN) 500 MG tablet      Meds ordered this encounter  Medications  . diclofenac Sodium (VOLTAREN) 1 % GEL    Sig: Apply a small grape sized dollop on wrist and back three times daily as needed.    Dispense:  350 g    Refill:  0  . methocarbamol (ROBAXIN) 500 MG tablet    Sig: Take 1 tablet (500 mg total) by mouth every 8 (eight) hours as needed for muscle spasms.    Dispense:  40 tablet    Refill:  0  . carbamide peroxide (DEBROX) 6.5 % OTIC  solution    Sig: Place 5 drops into both ears 2 (two) times daily.    Dispense:  15 mL    Refill:  0    Follow-up: Return in about 6 months (around 02/28/2020).   Given information on health maintenance and disease prevention.  Also given information on earwax buildup and psoriasis.  With facial involvement believe that this could be seborrheic dermatitis.  Asked for dermatology follow-up.  He will use Robaxin as needed for muscle spasms in his back.  Can use Voltaren gel on his wrist and lower back. Libby Maw, MD

## 2019-08-31 ENCOUNTER — Other Ambulatory Visit: Payer: Self-pay

## 2019-08-31 ENCOUNTER — Other Ambulatory Visit (INDEPENDENT_AMBULATORY_CARE_PROVIDER_SITE_OTHER): Payer: PPO

## 2019-08-31 DIAGNOSIS — Z0000 Encounter for general adult medical examination without abnormal findings: Secondary | ICD-10-CM

## 2019-08-31 DIAGNOSIS — Z125 Encounter for screening for malignant neoplasm of prostate: Secondary | ICD-10-CM | POA: Diagnosis not present

## 2019-08-31 LAB — LIPID PANEL
Cholesterol: 148 mg/dL (ref 0–200)
HDL: 77.3 mg/dL (ref >39.00)
LDL Cholesterol: 62 mg/dL (ref 0–99)
NonHDL: 70.33
Total CHOL/HDL Ratio: 2
Triglycerides: 40 mg/dL (ref 0.0–149.0)
VLDL: 8 mg/dL (ref 0.0–40.0)

## 2019-08-31 LAB — COMPREHENSIVE METABOLIC PANEL
ALT: 17 U/L (ref 0–53)
AST: 23 U/L (ref 0–37)
Albumin: 4.5 g/dL (ref 3.5–5.2)
Alkaline Phosphatase: 71 U/L (ref 39–117)
BUN: 10 mg/dL (ref 6–23)
CO2: 30 mEq/L (ref 19–32)
Calcium: 9.2 mg/dL (ref 8.4–10.5)
Chloride: 99 mEq/L (ref 96–112)
Creatinine, Ser: 0.83 mg/dL (ref 0.40–1.50)
GFR: 91.46 mL/min (ref >60.00)
Glucose, Bld: 101 mg/dL — ABNORMAL HIGH (ref 70–99)
Potassium: 4.1 mEq/L (ref 3.5–5.1)
Sodium: 138 mEq/L (ref 135–145)
Total Bilirubin: 0.9 mg/dL (ref 0.2–1.2)
Total Protein: 6.8 g/dL (ref 6.0–8.3)

## 2019-08-31 LAB — URINALYSIS, ROUTINE W REFLEX MICROSCOPIC
Bilirubin Urine: NEGATIVE
Hgb urine dipstick: NEGATIVE
Ketones, ur: NEGATIVE
Leukocytes,Ua: NEGATIVE
Nitrite: NEGATIVE
Specific Gravity, Urine: 1.01 (ref 1.000–1.030)
Total Protein, Urine: NEGATIVE
Urine Glucose: NEGATIVE
Urobilinogen, UA: 0.2 (ref 0.0–1.0)
pH: 6.5 (ref 5.0–8.0)

## 2019-08-31 LAB — PSA: PSA: 1.82 ng/mL (ref 0.10–4.00)

## 2019-08-31 LAB — CBC
HCT: 42.8 % (ref 39.0–52.0)
Hemoglobin: 14.3 g/dL (ref 13.0–17.0)
MCHC: 33.5 g/dL (ref 30.0–36.0)
MCV: 100.3 fl — ABNORMAL HIGH (ref 78.0–100.0)
Platelets: 320 10*3/uL (ref 150.0–400.0)
RBC: 4.26 Mil/uL (ref 4.22–5.81)
RDW: 13.3 % (ref 11.5–15.5)
WBC: 6.1 10*3/uL (ref 4.0–10.5)

## 2019-08-31 LAB — LDL CHOLESTEROL, DIRECT: Direct LDL: 64 mg/dL

## 2019-10-29 DIAGNOSIS — D225 Melanocytic nevi of trunk: Secondary | ICD-10-CM | POA: Diagnosis not present

## 2019-10-29 DIAGNOSIS — L218 Other seborrheic dermatitis: Secondary | ICD-10-CM | POA: Diagnosis not present

## 2019-10-29 DIAGNOSIS — D1801 Hemangioma of skin and subcutaneous tissue: Secondary | ICD-10-CM | POA: Diagnosis not present

## 2019-10-29 DIAGNOSIS — L812 Freckles: Secondary | ICD-10-CM | POA: Diagnosis not present

## 2019-10-29 DIAGNOSIS — L821 Other seborrheic keratosis: Secondary | ICD-10-CM | POA: Diagnosis not present

## 2019-10-29 DIAGNOSIS — L400 Psoriasis vulgaris: Secondary | ICD-10-CM | POA: Diagnosis not present

## 2019-11-12 ENCOUNTER — Telehealth: Payer: Self-pay | Admitting: Family Medicine

## 2019-11-12 NOTE — Telephone Encounter (Signed)
Left message for patient to schedule Annual Wellness Visit.  Please schedule with Nurse Health Advisor Martha Stanley, RN at [facility]  °

## 2019-12-22 ENCOUNTER — Other Ambulatory Visit: Payer: Self-pay | Admitting: Family Medicine

## 2019-12-22 DIAGNOSIS — M545 Low back pain, unspecified: Secondary | ICD-10-CM

## 2019-12-31 ENCOUNTER — Other Ambulatory Visit: Payer: PPO

## 2019-12-31 DIAGNOSIS — Z20822 Contact with and (suspected) exposure to covid-19: Secondary | ICD-10-CM

## 2020-01-02 LAB — SARS-COV-2, NAA 2 DAY TAT

## 2020-01-02 LAB — NOVEL CORONAVIRUS, NAA: SARS-CoV-2, NAA: NOT DETECTED

## 2020-03-21 ENCOUNTER — Other Ambulatory Visit: Payer: Self-pay | Admitting: Family Medicine

## 2020-03-21 DIAGNOSIS — M545 Low back pain, unspecified: Secondary | ICD-10-CM

## 2020-03-21 DIAGNOSIS — M25532 Pain in left wrist: Secondary | ICD-10-CM

## 2020-04-28 DIAGNOSIS — M5459 Other low back pain: Secondary | ICD-10-CM | POA: Diagnosis not present

## 2020-05-13 DIAGNOSIS — M47816 Spondylosis without myelopathy or radiculopathy, lumbar region: Secondary | ICD-10-CM | POA: Diagnosis not present

## 2020-05-13 DIAGNOSIS — R030 Elevated blood-pressure reading, without diagnosis of hypertension: Secondary | ICD-10-CM | POA: Diagnosis not present

## 2020-05-31 DIAGNOSIS — M5440 Lumbago with sciatica, unspecified side: Secondary | ICD-10-CM | POA: Diagnosis not present

## 2020-06-08 ENCOUNTER — Telehealth: Payer: Self-pay | Admitting: Family Medicine

## 2020-06-08 NOTE — Telephone Encounter (Signed)
Left message for patient to call back and schedule Medicare Annual Wellness Visit (AWV).   Please offer to do virtually or by telephone.   Due for AWVI  Please schedule at anytime with Nurse Health Advisor.   

## 2020-08-29 ENCOUNTER — Encounter: Payer: Self-pay | Admitting: Family Medicine

## 2020-08-29 ENCOUNTER — Other Ambulatory Visit: Payer: Self-pay

## 2020-08-29 ENCOUNTER — Ambulatory Visit (INDEPENDENT_AMBULATORY_CARE_PROVIDER_SITE_OTHER): Payer: PPO | Admitting: Family Medicine

## 2020-08-29 VITALS — BP 138/76 | HR 59 | Temp 97.9°F | Ht 70.0 in | Wt 166.8 lb

## 2020-08-29 DIAGNOSIS — Z0001 Encounter for general adult medical examination with abnormal findings: Secondary | ICD-10-CM

## 2020-08-29 DIAGNOSIS — R351 Nocturia: Secondary | ICD-10-CM | POA: Diagnosis not present

## 2020-08-29 DIAGNOSIS — F528 Other sexual dysfunction not due to a substance or known physiological condition: Secondary | ICD-10-CM

## 2020-08-29 DIAGNOSIS — H9190 Unspecified hearing loss, unspecified ear: Secondary | ICD-10-CM | POA: Diagnosis not present

## 2020-08-29 DIAGNOSIS — Z0000 Encounter for general adult medical examination without abnormal findings: Secondary | ICD-10-CM

## 2020-08-29 LAB — LIPID PANEL
Cholesterol: 139 mg/dL (ref 0–200)
HDL: 63.3 mg/dL (ref >39.00)
LDL Cholesterol: 65 mg/dL (ref 0–99)
NonHDL: 75.2
Total CHOL/HDL Ratio: 2
Triglycerides: 49 mg/dL (ref 0.0–149.0)
VLDL: 9.8 mg/dL (ref 0.0–40.0)

## 2020-08-29 LAB — URINALYSIS, ROUTINE W REFLEX MICROSCOPIC
Bilirubin Urine: NEGATIVE
Hgb urine dipstick: NEGATIVE
Ketones, ur: NEGATIVE
Leukocytes,Ua: NEGATIVE
Nitrite: NEGATIVE
RBC / HPF: NONE SEEN (ref 0–?)
Specific Gravity, Urine: 1.01 (ref 1.000–1.030)
Total Protein, Urine: NEGATIVE
Urine Glucose: NEGATIVE
Urobilinogen, UA: 0.2 (ref 0.0–1.0)
WBC, UA: NONE SEEN (ref 0–?)
pH: 7 (ref 5.0–8.0)

## 2020-08-29 LAB — CBC
HCT: 41.2 % (ref 39.0–52.0)
Hemoglobin: 13.6 g/dL (ref 13.0–17.0)
MCHC: 32.9 g/dL (ref 30.0–36.0)
MCV: 98.5 fl (ref 78.0–100.0)
Platelets: 332 10*3/uL (ref 150.0–400.0)
RBC: 4.18 Mil/uL — ABNORMAL LOW (ref 4.22–5.81)
RDW: 14.2 % (ref 11.5–15.5)
WBC: 5.2 10*3/uL (ref 4.0–10.5)

## 2020-08-29 LAB — COMPREHENSIVE METABOLIC PANEL
ALT: 15 U/L (ref 0–53)
AST: 19 U/L (ref 0–37)
Albumin: 4.2 g/dL (ref 3.5–5.2)
Alkaline Phosphatase: 64 U/L (ref 39–117)
BUN: 10 mg/dL (ref 6–23)
CO2: 31 mEq/L (ref 19–32)
Calcium: 9.1 mg/dL (ref 8.4–10.5)
Chloride: 100 mEq/L (ref 96–112)
Creatinine, Ser: 0.91 mg/dL (ref 0.40–1.50)
GFR: 84.8 mL/min (ref >60.00)
Glucose, Bld: 85 mg/dL (ref 70–99)
Potassium: 4.5 mEq/L (ref 3.5–5.1)
Sodium: 137 mEq/L (ref 135–145)
Total Bilirubin: 0.6 mg/dL (ref 0.2–1.2)
Total Protein: 6.6 g/dL (ref 6.0–8.3)

## 2020-08-29 LAB — PSA: PSA: 1.97 ng/mL (ref 0.10–4.00)

## 2020-08-29 MED ORDER — SILDENAFIL CITRATE 20 MG PO TABS: ORAL_TABLET | ORAL | 10 refills | Status: DC

## 2020-08-29 NOTE — Progress Notes (Signed)
Established Patient Office Visit  Subjective:  Patient ID: Todd Carpenter, male    DOB: [birth year]  Age: 70 y.o. MRN: [id number]  CC:  Chief Complaint  Patient presents with   Annual Exam    CPE, no concerns patient fasting.     HPI Todd Carpenter presents for yearly physical.  Mostly doing well.  Continues to play golf regularly.  Seeing neurosurgeon as needed for neck and lower back pain.  Recent injections into both areas have been somewhat helpful.  Admits to nocturia x3 on occasion.  Drinks coffee in the evening and does not fluids before bedtime.  Recommendations helpful for ED issues.  Past Medical History:  Diagnosis Date   BENIGN PROSTATIC HYPERTROPHY 09/18/2006   Degenerative disc disease    PSORIASIS 07/14/2007    Past Surgical History:  Procedure Laterality Date   BACK SURGERY  1027,2536   NECK SURGERY  03/16/2010    Family History  Problem Relation Age of Onset   Cervical cancer Other    Cancer Other        prostate cancer   Ulcers Other    Heart disease Other    Heart disease Sister    Heart disease Brother    Colon cancer Neg Hx     Social History   Socioeconomic History   Marital status: Married    Spouse name: Not on file   Number of children: Not on file   Years of education: Not on file   Highest education level: Not on file  Occupational History   Not on file  Tobacco Use   Smoking status: Former   Smokeless tobacco: Never  Vaping Use   Vaping Use: Never used  Substance and Sexual Activity   Alcohol use: Yes    Alcohol/week: 0.0 standard drinks    Comment: 3-4 times per year,per patient.    Drug use: No   Sexual activity: Not on file  Other Topics Concern   Not on file  Social History Narrative   Not on file   Social Determinants of Health   Financial Resource Strain: Not on file  Food Insecurity: Not on file  Transportation Needs: Not on file  Physical Activity: Not on file  Stress: Not on file  Social Connections: Not on file   Intimate Partner Violence: Not on file    Outpatient Medications Prior to Visit  Medication Sig Dispense Refill   b complex vitamins capsule Take 1 capsule by mouth daily.     sildenafil (REVATIO) 20 MG tablet Take 20 mg by mouth 3 (three) times daily.     Turmeric (QC TUMERIC COMPLEX PO) Take by mouth.     sildenafil (VIAGRA) 25 MG tablet Take 25 mg by mouth daily as needed for erectile dysfunction.     carbamide peroxide (DEBROX) 6.5 % OTIC solution Place 5 drops into both ears 2 (two) times daily. (Patient not taking: Reported on 08/29/2020) 15 mL 0   Carbamide Peroxide-Saline (EAR WAX CLEANSING) 6.5 % KIT Follow kit instructions (Patient not taking: Reported on 07/27/2019) 1 kit 1   diclofenac Sodium (VOLTAREN) 1 % GEL APPLY A SMALL GRAPE SIZED DOLLOP ON WRIST AND BACK THREE TIMES DAILY AS NEEDED. (Patient not taking: Reported on 08/29/2020) 300 g 0   doxycycline (VIBRA-TABS) 100 MG tablet Take 1 tablet (100 mg total) by mouth 2 (two) times daily. (Patient not taking: Reported on 08/28/2019) 20 tablet 0   fluocinonide cream (LIDEX) 0.05 % Apply topically 2 (  two) times daily.     methocarbamol (ROBAXIN) 500 MG tablet TAKE 1 TABLET BY MOUTH EVERY 8 HOURS AS NEEDED FOR MUSCLE SPASMS. 40 tablet 0   sildenafil (REVATIO) 20 MG tablet Use as directed 10 tablet 10   No facility-administered medications prior to visit.    No Known Allergies  ROS Review of Systems  Constitutional:  Negative for chills, diaphoresis, fatigue, fever and unexpected weight change.  HENT:  Positive for hearing loss. Negative for ear discharge and postnasal drip.   Eyes:  Negative for photophobia and visual disturbance.  Respiratory: Negative.    Cardiovascular: Negative.   Gastrointestinal: Negative.   Endocrine: Negative for polyphagia and polyuria.  Genitourinary:  Positive for difficulty urinating. Negative for frequency and urgency.  Musculoskeletal:  Positive for back pain and neck pain.  Skin:  Positive for  rash.  Allergic/Immunologic: Negative for immunocompromised state.  Neurological:  Negative for speech difficulty and weakness.     Depression screen [facility] 2/9 08/29/2020 08/29/2020 08/28/2019  Decreased Interest 0 0 0  Down, Depressed, Hopeless 0 0 0  PHQ - 2 Score 0 0 0  Altered sleeping 0 - 0  Tired, decreased energy 0 - 0  Change in appetite 0 - 0  Feeling bad or failure about yourself  0 - 0  Trouble concentrating 0 - 0  Moving slowly or fidgety/restless 0 - 0  Suicidal thoughts 0 - 0  PHQ-9 Score 0 - 0     Objective:    Physical Exam Vitals and nursing note reviewed.  Constitutional:      General: He is not in acute distress.    Appearance: Normal appearance. He is normal weight. He is not ill-appearing, toxic-appearing or diaphoretic.  HENT:     Head: Normocephalic and atraumatic.     Right Ear: Tympanic membrane, ear canal and external ear normal.     Left Ear: Tympanic membrane, ear canal and external ear normal.     Mouth/Throat:     Mouth: Mucous membranes are moist.     Pharynx: Oropharynx is clear. No oropharyngeal exudate or posterior oropharyngeal erythema.  Eyes:     General: No scleral icterus.       Right eye: No discharge.        Left eye: No discharge.     Conjunctiva/sclera: Conjunctivae normal.     Pupils: Pupils are equal, round, and reactive to light.  Neck:     Vascular: No carotid bruit.  Cardiovascular:     Rate and Rhythm: Normal rate and regular rhythm.  Pulmonary:     Effort: Pulmonary effort is normal.     Breath sounds: Normal breath sounds.  Abdominal:     General: Abdomen is flat. Bowel sounds are normal. There is no distension.     Palpations: Abdomen is soft. There is no mass.     Tenderness: There is no abdominal tenderness. There is no guarding or rebound.     Hernia: No hernia is present.  Musculoskeletal:     Cervical back: No rigidity or tenderness.     Right lower leg: No edema.     Left lower leg: No edema.   Lymphadenopathy:     Cervical: No cervical adenopathy.  Skin:    General: Skin is warm and dry.  Neurological:     Mental Status: He is alert and oriented to person, place, and time.    BP 138/76 (BP Location: Left Arm, Patient Position: Sitting, Cuff Size: Normal)  Pulse (!) 59   Temp 97.9 F (36.6 C) (Temporal)   Ht '5\' 10"'  (1.778 m)   Wt 166 lb 12.8 oz (75.7 kg)   SpO2 97%   BMI 23.93 kg/m  Wt Readings from Last 3 Encounters:  08/29/20 166 lb 12.8 oz (75.7 kg)  08/28/19 169 lb (76.7 kg)  07/27/19 174 lb 3.2 oz (79 kg)     Health Maintenance Due  Topic Date Due   Zoster Vaccines- Shingrix (1 of 2) Never done   TETANUS/TDAP  07/13/2017   COVID-19 Vaccine (3 - Booster for Pfizer series) 08/24/2019   INFLUENZA VACCINE  08/08/2020    There are no preventive care reminders to display for this patient.  Lab Results  Component Value Date   TSH 2.49 02/16/2016   Lab Results  Component Value Date   WBC 6.1 08/31/2019   HGB 14.3 08/31/2019   HCT 42.8 08/31/2019   MCV 100.3 (H) 08/31/2019   PLT 320.0 08/31/2019   Lab Results  Component Value Date   NA 138 08/31/2019   K 4.1 08/31/2019   CO2 30 08/31/2019   GLUCOSE 101 (H) 08/31/2019   BUN 10 08/31/2019   CREATININE 0.83 08/31/2019   BILITOT 0.9 08/31/2019   ALKPHOS 71 08/31/2019   AST 23 08/31/2019   ALT 17 08/31/2019   PROT 6.8 08/31/2019   ALBUMIN 4.5 08/31/2019   CALCIUM 9.2 08/31/2019   GFR 91.46 08/31/2019   Lab Results  Component Value Date   CHOL 148 08/31/2019   Lab Results  Component Value Date   HDL 77.30 08/31/2019   Lab Results  Component Value Date   LDLCALC 62 08/31/2019   Lab Results  Component Value Date   TRIG 40.0 08/31/2019   Lab Results  Component Value Date   CHOLHDL 2 08/31/2019   No results found for: HGBA1C    Assessment & Plan:   Problem List Items Addressed This Visit       Nervous and Auditory   Hearing loss     Other   ERECTILE DYSFUNCTION   Relevant  Medications   sildenafil (REVATIO) 20 MG tablet   Healthcare maintenance - Primary   Relevant Orders   Comprehensive metabolic panel   Lipid panel   Urinalysis, Routine w reflex microscopic   CBC   Nocturia   Relevant Orders   PSA    Meds ordered this encounter  Medications   sildenafil (REVATIO) 20 MG tablet    Sig: Use as directed    Dispense:  10 tablet    Refill:  10     Follow-up: Return in about 6 months (around 03/01/2021).   Patient was given information on health maintenance and disease prevention.  Also recommended the Shingrix vaccine and will receive from his pharmacy.  Advised hearing evaluation.  He will hold caffeine past the morning hours and hold fluids 2 hours before bedtime.  We will use Revatio as needed.  Libby Maw, MD

## 2020-10-20 ENCOUNTER — Telehealth: Payer: Self-pay | Admitting: *Deleted

## 2020-10-20 NOTE — Chronic Care Management (AMB) (Signed)
  Chronic Care Management   Note  10/20/2020 Name: Todd Carpenter MRN: [id number] DOB: 01/12/1950  Todd Carpenter is a 70 y.o. year old male who is a primary care patient of Libby Maw, MD. I reached out to Letta Pate by phone today in response to a referral sent by [name] PCP.  [name] was given information about Chronic Care Management services today including:  CCM service includes personalized support from designated clinical staff supervised by his physician, including individualized plan of care and coordination with other care providers 24/7 contact phone numbers for assistance for urgent and routine care needs. Service will only be billed when office clinical staff spend 20 minutes or more in a month to coordinate care. Only one practitioner may furnish and bill the service in a calendar month. The patient may stop CCM services at any time (effective at the end of the month) by phone call to the office staff. The patient is responsible for co-pay (up to 20% after annual deductible is met) if co-pay is required by the individual health plan.   Patient did not agree to enrollment in care management services and does not wish to consider at this time.  Follow up plan: Patient declines further follow up and engagement by the care management team. Appropriate care team members and provider have been notified via electronic communication.   Julian Hy, [facility]  Direct Dial: [phone]

## 2020-11-01 ENCOUNTER — Telehealth: Payer: Self-pay | Admitting: Family Medicine

## 2020-11-01 NOTE — Telephone Encounter (Signed)
Left message for patient to call back and schedule Medicare Annual Wellness Visit (AWV) in office.  ° °If not able to come in office, please offer to do virtually or by telephone.  Left office number and my jabber #[phone]. ° °Due for AWVI ° °Please schedule at anytime with Nurse Health Advisor. °  °

## 2020-11-17 DIAGNOSIS — H903 Sensorineural hearing loss, bilateral: Secondary | ICD-10-CM | POA: Diagnosis not present

## 2021-02-07 ENCOUNTER — Other Ambulatory Visit: Payer: Self-pay

## 2021-02-07 ENCOUNTER — Ambulatory Visit (INDEPENDENT_AMBULATORY_CARE_PROVIDER_SITE_OTHER): Payer: PPO | Admitting: Family Medicine

## 2021-02-07 ENCOUNTER — Encounter: Payer: Self-pay | Admitting: Family Medicine

## 2021-02-07 VITALS — BP 118/78 | HR 69 | Temp 97.6°F | Ht 70.0 in | Wt 166.8 lb

## 2021-02-07 DIAGNOSIS — A09 Infectious gastroenteritis and colitis, unspecified: Secondary | ICD-10-CM | POA: Diagnosis not present

## 2021-02-07 LAB — CBC
HCT: 44.3 % (ref 39.0–52.0)
Hemoglobin: 14.5 g/dL (ref 13.0–17.0)
MCHC: 32.7 g/dL (ref 30.0–36.0)
MCV: 96.8 fl (ref 78.0–100.0)
Platelets: 328 10*3/uL (ref 150.0–400.0)
RBC: 4.58 Mil/uL (ref 4.22–5.81)
RDW: 13.2 % (ref 11.5–15.5)
WBC: 7.8 10*3/uL (ref 4.0–10.5)

## 2021-02-07 LAB — COMPREHENSIVE METABOLIC PANEL
ALT: 14 U/L (ref 0–53)
AST: 18 U/L (ref 0–37)
Albumin: 4.2 g/dL (ref 3.5–5.2)
Alkaline Phosphatase: 62 U/L (ref 39–117)
BUN: 11 mg/dL (ref 6–23)
CO2: 33 mEq/L — ABNORMAL HIGH (ref 19–32)
Calcium: 9.5 mg/dL (ref 8.4–10.5)
Chloride: 100 mEq/L (ref 96–112)
Creatinine, Ser: 0.95 mg/dL (ref 0.40–1.50)
GFR: 80.28 mL/min (ref >60.00)
Glucose, Bld: 72 mg/dL (ref 70–99)
Potassium: 4.7 mEq/L (ref 3.5–5.1)
Sodium: 137 mEq/L (ref 135–145)
Total Bilirubin: 0.6 mg/dL (ref 0.2–1.2)
Total Protein: 6.4 g/dL (ref 6.0–8.3)

## 2021-02-07 LAB — AMYLASE: Amylase: 49 U/L (ref 27–131)

## 2021-02-07 NOTE — Progress Notes (Signed)
Established Patient Office Visit  Subjective:  Patient ID: Todd Carpenter, male    DOB: [birth year]  Age: 71 y.o. MRN: [id number]  CC:  Chief Complaint  Patient presents with   GI Problem    No appetite lots of diarrhea, not able to keep anything on his stomach symptoms x 2 weeks.     HPI Todd Carpenter presents for evaluation of a 2-week history of watery diarrhea to loose to semiformed stools.  What formed stool there is is about the size of a blueberry.  Patient denies nausea vomiting decrease in appetite fever or chills.  He has seen no blood or pus in his stools.  No recent antibiotics.  Wife is not affected.  No significant abdominal pain.  Feels the need to evacuate his bowels directly after eating.  He has been hydrating well.  No alcohol.  Past Medical History:  Diagnosis Date   BENIGN PROSTATIC HYPERTROPHY 09/18/2006   Degenerative disc disease    PSORIASIS 07/14/2007    Past Surgical History:  Procedure Laterality Date   BACK SURGERY  0947,0962   NECK SURGERY  03/16/2010    Family History  Problem Relation Age of Onset   Cervical cancer Other    Cancer Other        prostate cancer   Ulcers Other    Heart disease Other    Heart disease Sister    Heart disease Brother    Colon cancer Neg Hx     Social History   Socioeconomic History   Marital status: Married    Spouse name: Not on file   Number of children: Not on file   Years of education: Not on file   Highest education level: Not on file  Occupational History   Not on file  Tobacco Use   Smoking status: Former   Smokeless tobacco: Never  Vaping Use   Vaping Use: Never used  Substance and Sexual Activity   Alcohol use: Yes    Alcohol/week: 0.0 standard drinks    Comment: 3-4 times per year,per patient.    Drug use: No   Sexual activity: Not on file  Other Topics Concern   Not on file  Social History Narrative   Not on file   Social Determinants of Health   Financial Resource Strain: Not on file   Food Insecurity: Not on file  Transportation Needs: Not on file  Physical Activity: Not on file  Stress: Not on file  Social Connections: Not on file  Intimate Partner Violence: Not on file    Outpatient Medications Prior to Visit  Medication Sig Dispense Refill   b complex vitamins capsule Take 1 capsule by mouth daily.     COLLAGEN PO Take by mouth.     Turmeric (QC TUMERIC COMPLEX PO) Take by mouth.     sildenafil (REVATIO) 20 MG tablet Use as directed 10 tablet 10   sildenafil (REVATIO) 20 MG tablet Take 20 mg by mouth 3 (three) times daily. (Patient not taking: Reported on 02/07/2021)     No facility-administered medications prior to visit.    No Known Allergies  ROS Review of Systems  Constitutional:  Negative for diaphoresis, fatigue, fever and unexpected weight change.  HENT: Negative.    Eyes:  Negative for photophobia.  Respiratory: Negative.    Cardiovascular: Negative.   Gastrointestinal:  Positive for diarrhea. Negative for abdominal distention, abdominal pain, anal bleeding, blood in stool, constipation, nausea and vomiting.  Genitourinary: Negative.  Musculoskeletal:  Negative for arthralgias and myalgias.  Skin:  Negative for color change and rash.  Neurological:  Negative for speech difficulty, weakness and light-headedness.     Objective:    Physical Exam Vitals and nursing note reviewed.  Constitutional:      General: He is not in acute distress.    Appearance: Normal appearance. He is not ill-appearing, toxic-appearing or diaphoretic.  HENT:     Head: Normocephalic and atraumatic.     Right Ear: External ear normal.     Left Ear: External ear normal.     Mouth/Throat:     Mouth: Mucous membranes are moist.     Pharynx: Oropharynx is clear. No oropharyngeal exudate or posterior oropharyngeal erythema.  Eyes:     General: No scleral icterus.       Right eye: No discharge.        Left eye: No discharge.     Extraocular Movements: Extraocular  movements intact.     Conjunctiva/sclera: Conjunctivae normal.     Pupils: Pupils are equal, round, and reactive to light.  Cardiovascular:     Rate and Rhythm: Normal rate and regular rhythm.  Pulmonary:     Effort: Pulmonary effort is normal.     Breath sounds: Normal breath sounds.  Abdominal:     General: Abdomen is flat. Bowel sounds are normal. There is no distension.     Palpations: Abdomen is soft. There is no mass.     Tenderness: There is no abdominal tenderness. There is no right CVA tenderness, left CVA tenderness, guarding or rebound.     Hernia: No hernia is present.  Musculoskeletal:     Cervical back: No rigidity or tenderness.  Lymphadenopathy:     Cervical: No cervical adenopathy.  Skin:    General: Skin is warm and dry.  Neurological:     Mental Status: He is alert and oriented to person, place, and time.  Psychiatric:        Mood and Affect: Mood normal.        Behavior: Behavior normal.    BP 118/78 (BP Location: Left Arm, Patient Position: Sitting, Cuff Size: Normal)    Pulse 69    Temp 97.6 F (36.4 C) (Temporal)    Ht 5\' 10"  (1.778 m)    Wt 166 lb 12.8 oz (75.7 kg)    SpO2 97%    BMI 23.93 kg/m  Wt Readings from Last 3 Encounters:  02/07/21 166 lb 12.8 oz (75.7 kg)  08/29/20 166 lb 12.8 oz (75.7 kg)  08/28/19 169 lb (76.7 kg)     Health Maintenance Due  Topic Date Due   Zoster Vaccines- Shingrix (1 of 2) Never done   TETANUS/TDAP  07/13/2017    There are no preventive care reminders to display for this patient.  Lab Results  Component Value Date   TSH 2.49 02/16/2016   Lab Results  Component Value Date   WBC 5.2 08/29/2020   HGB 13.6 08/29/2020   HCT 41.2 08/29/2020   MCV 98.5 08/29/2020   PLT 332.0 08/29/2020   Lab Results  Component Value Date   NA 137 08/29/2020   K 4.5 08/29/2020   CO2 31 08/29/2020   GLUCOSE 85 08/29/2020   BUN 10 08/29/2020   CREATININE 0.91 08/29/2020   BILITOT 0.6 08/29/2020   ALKPHOS 64 08/29/2020    AST 19 08/29/2020   ALT 15 08/29/2020   PROT 6.6 08/29/2020   ALBUMIN 4.2 08/29/2020   CALCIUM  9.1 08/29/2020   GFR 84.80 08/29/2020   Lab Results  Component Value Date   CHOL 139 08/29/2020   Lab Results  Component Value Date   HDL 63.30 08/29/2020   Lab Results  Component Value Date   LDLCALC 65 08/29/2020   Lab Results  Component Value Date   TRIG 49.0 08/29/2020   Lab Results  Component Value Date   CHOLHDL 2 08/29/2020   No results found for: HGBA1C    Assessment & Plan:   Problem List Items Addressed This Visit       Digestive   Dysentery - Primary   Relevant Orders   CBC   Comprehensive metabolic panel   Stool, WBC/Lactoferrin   Clostridium Difficile by PCR([facility])   Amylase   Gastrointestinal Panel by PCR , Stool    No orders of the defined types were placed in this encounter.   Follow-up: Return in about 2 weeks (around 02/21/2021), or Continue to hydrate well.Libby Maw, MD

## 2021-02-08 ENCOUNTER — Other Ambulatory Visit: Payer: PPO

## 2021-02-08 DIAGNOSIS — A09 Infectious gastroenteritis and colitis, unspecified: Secondary | ICD-10-CM | POA: Diagnosis not present

## 2021-02-10 LAB — FECAL LACTOFERRIN, QUANT
Fecal Lactoferrin: POSITIVE — AB
MICRO NUMBER:: 12955253
SPECIMEN QUALITY:: ADEQUATE

## 2021-02-10 LAB — CLOSTRIDIUM DIFFICILE BY PCR: Toxigenic C. Difficile by PCR: NEGATIVE

## 2021-02-20 DIAGNOSIS — R197 Diarrhea, unspecified: Secondary | ICD-10-CM | POA: Diagnosis not present

## 2021-02-21 DIAGNOSIS — R197 Diarrhea, unspecified: Secondary | ICD-10-CM | POA: Diagnosis not present

## 2021-02-27 ENCOUNTER — Ambulatory Visit: Payer: PPO | Admitting: Family Medicine

## 2021-03-10 ENCOUNTER — Encounter: Payer: Self-pay | Admitting: Nurse Practitioner

## 2021-03-10 ENCOUNTER — Ambulatory Visit (INDEPENDENT_AMBULATORY_CARE_PROVIDER_SITE_OTHER): Payer: PPO | Admitting: Nurse Practitioner

## 2021-03-10 VITALS — BP 130/90 | HR 84 | Ht 70.0 in | Wt 168.0 lb

## 2021-03-10 DIAGNOSIS — R197 Diarrhea, unspecified: Secondary | ICD-10-CM

## 2021-03-10 DIAGNOSIS — R1030 Lower abdominal pain, unspecified: Secondary | ICD-10-CM | POA: Diagnosis not present

## 2021-03-10 NOTE — Progress Notes (Signed)
? ? ? ?03/10/2021 ?Todd Carpenter ?[id number] ?31-Aug-1950 ? ? ?CHIEF COMPLAINT: Diarrhea, lower abdominal pain ? ?HISTORY OF PRESENT ILLNESS: Todd Carpenter is a 71 year old male with a past medical history of psoriasis, BPH, degenerative disc disease and alcohol use disorder.  He presents to our office today as referred by Dr. Crissie Sickles for further evaluation regarding diarrhea.  He developed diarrhea around 01/19/2021 without any specific food or acute illness triggers.  He described having a moderate amount of abdominal gas with lower abdominal cramp pain which resolved after he passed a small volume nonbloody diarrhea stool with a large amount of explosive gas x 2 or 3 times every 15 minutes in the mornings.  His bowel movements were urgent and a few times he thought he may not reach the bathroom in time.  No antibiotic use within the past few months.  He was seen by his PCP Dr. Ethelene Hal and's stool studies including C. Difficile PCR was negative and a lactoferrin level was positive.  His symptoms are continued and he switched primary care physicians. He was seen by Dr. Curly Rim and stool cultures were ordered, the results were negative.  He stopped taking collagen and turmeric supplement without improvement.  His dairy intake is limited.  He typically drinks 8-12 beers daily and decided to reduce his beer intake to 2-3 beers daily and within 1 week his diarrhea abated.  Since then, he is back to his normal bowel pattern which consist of passing normal formed brown bowel movement daily.  No rectal bleeding or black stools.  He eats a fairly healthy diet including grains, vegetables and protein once or twice daily.  He underwent a colonoscopy by Dr. Fuller Plan 05/05/2014 which showed mild diverticulosis in the sigmoid colon otherwise was normal.  No other complaints at this time. ? ?CBC Latest Ref Rng & Units 02/07/2021 08/29/2020 08/31/2019  ?WBC 4.0 - 10.5 K/uL 7.8 5.2 6.1  ?Hemoglobin 13.0 - 17.0 g/dL 14.5 13.6 14.3   ?Hematocrit 39.0 - 52.0 % 44.3 41.2 42.8  ?Platelets 150.0 - 400.0 K/uL 328.0 332.0 320.0  ?  ?CMP Latest Ref Rng & Units 02/07/2021 08/29/2020 08/31/2019  ?Glucose 70 - 99 mg/dL 72 85 101(H)  ?BUN 6 - 23 mg/dL 11 10 10   ?Creatinine 0.40 - 1.50 mg/dL 0.95 0.91 0.83  ?Sodium 135 - 145 mEq/L 137 137 138  ?Potassium 3.5 - 5.1 mEq/L 4.7 4.5 4.1  ?Chloride 96 - 112 mEq/L 100 100 99  ?CO2 19 - 32 mEq/L 33(H) 31 30  ?Calcium 8.4 - 10.5 mg/dL 9.5 9.1 9.2  ?Total Protein 6.0 - 8.3 g/dL 6.4 6.6 6.8  ?Total Bilirubin 0.2 - 1.2 mg/dL 0.6 0.6 0.9  ?Alkaline Phos 39 - 117 U/L 62 64 71  ?AST 0 - 37 U/L 18 19 23   ?ALT 0 - 53 U/L 14 15 17   ?  ? ?Past Medical History:  ?Diagnosis Date  ? BENIGN PROSTATIC HYPERTROPHY 09/18/2006  ? Degenerative disc disease   ? PSORIASIS 07/14/2007  ? ?Past Surgical History:  ?Procedure Laterality Date  ? BACK SURGERY  [phone]  ? NECK SURGERY  03/16/2010  ?Carpal tunnel surgery.  ? ?Social History: He is married.  He has 2 daughters.  Past smoker.  He typically drinks 8-12 beers daily but recently reduced his beer intake to 2-3 beers daily.  No drug use ? ?Family History: family history includes Cancer in an other family member; Cervical cancer in an other family member; Heart disease  in his brother, sister, and another family member; Ulcers in an other family member.  No known family history of esophageal, gastric or colon cancer. ? ?No Known Allergies ? ?  ?Outpatient Encounter Medications as of 03/10/2021  ?Medication Sig  ? b complex vitamins capsule Take 1 capsule by mouth daily.  ? COLLAGEN PO Take by mouth.  ? sildenafil (REVATIO) 20 MG tablet Take 20 mg by mouth 3 (three) times daily. (Patient not taking: Reported on 02/07/2021)  ? Turmeric (QC TUMERIC COMPLEX PO) Take by mouth.  ? ?No facility-administered encounter medications on file as of 03/10/2021.  ? ? ?REVIEW OF SYSTEMS:  ?Gen: Denies fever, sweats or chills. No weight loss.  ?CV: Denies chest pain, palpitations or edema. ?Resp: Denies cough,  shortness of breath of hemoptysis.  ?GI: See HPI.  Denies heartburn, dysphagia or upper abdominal pain. ?GU : Denies urinary burning, blood in urine, increased urinary frequency or incontinence. ?MS: Denies joint pain, muscles aches or weakness. ?Derm: Denies rash, itchiness, skin lesions or unhealing ulcers. ?Psych: Denies depression, anxiety or memory loss ?Heme: Denies bruising, easy bleeding. ?Neuro:  Denies headaches, dizziness or paresthesias. ?Endo:  Denies any problems with DM, thyroid or adrenal function. ? ?PHYSICAL EXAM: ?BP 130/90   Pulse 84   Ht 5\' 10"  (1.778 m)   Wt 168 lb (76.2 kg)   SpO2 94%   BMI 24.11 kg/m?  ? ?General: Pleasant 71 year old male in no acute distress. ?Head: Normocephalic and atraumatic. ?Eyes:  Sclerae non-icteric, conjunctive pink. ?Ears: Normal auditory acuity. ?Mouth: Dentures intact.  No ulcers or lesions.  ?Neck: Supple, no lymphadenopathy or thyromegaly.  ?Lungs: Clear bilaterally to auscultation without wheezes, crackles or rhonchi. ?Heart: Regular rate and rhythm. No murmur, rub or gallop appreciated.  ?Abdomen: Soft, nontender, non distended. No masses. No hepatosplenomegaly. Normoactive bowel sounds x 4 quadrants.  ?Rectal: Deferred. ?Musculoskeletal: Symmetrical with no gross deformities. ?Skin: Warm and dry. No rash or lesions on visible extremities. ?Extremities: No edema. ?Neurological: Alert oriented x 4, no focal deficits.  ?Psychological:  Alert and cooperative. Normal mood and affect. ? ?ASSESSMENT AND PLAN: ? ?58) 71 year old male with nonbloody diarrhea, abdominal gas and lower abdominal cramping pain which started mid January 2023 and abated after he reduced his alcohol intake.  C. difficile and stool cultures were negative.  Elevated lactoferrin level.  No known family history of IBD or colorectal cancer.  WBC 7.8.  Hemoglobin 14.5. ?-Patient encouraged to continue to reduce alcohol intake, consider no alcohol consumption ?-Patient will contact her office  if diarrhea or lower abdominal pain recurs ? ?2) Colon cancer screening.  Colonoscopy 04/2014 showed diverticulosis, no polyps. ?-Next colonoscopy due 04/2024 medically appropriate at that time ? ?3) Alcohol use disorder.  Normal LFTs. ?-See recommendations #1 ? ? ? ?CC:  Libby Maw,* ? ? ? ?

## 2021-03-10 NOTE — Patient Instructions (Signed)
RECOMMENDATIONS: ? ?Continue to reduce beer intake. ?Please contact our office if your abdominal pain or diarrhea recurs. ?Next colonoscopy due April 2026 ? ? ?Thank you for trusting me with your gastrointestinal care!   ? ?Noralyn Pick, CRNP ? ? ? ?BMI: ? ?If you are age 72 or older, your body mass index should be between 23-30. Your Body mass index is 24.11 kg/m?Marland Kitchen If this is out of the aforementioned range listed, please consider follow up with your Primary Care Provider. ? ?If you are age 37 or younger, your body mass index should be between 19-25. Your Body mass index is 24.11 kg/m?Marland Kitchen If this is out of the aformentioned range listed, please consider follow up with your Primary Care Provider.  ? ?MY CHART: ? ?The [facility] providers would like to encourage you to use [facility] to communicate with providers for non-urgent requests or questions.  Due to long hold times on the telephone, sending your provider a message by [facility] may be a faster and more efficient way to get a response.  Please allow 48 business hours for a response.  Please remember that this is for non-urgent requests.  ? ?

## 2021-03-16 DIAGNOSIS — M5416 Radiculopathy, lumbar region: Secondary | ICD-10-CM | POA: Diagnosis not present

## 2021-03-16 DIAGNOSIS — M5417 Radiculopathy, lumbosacral region: Secondary | ICD-10-CM | POA: Diagnosis not present

## 2021-04-13 DIAGNOSIS — M5417 Radiculopathy, lumbosacral region: Secondary | ICD-10-CM | POA: Diagnosis not present

## 2021-04-13 DIAGNOSIS — R634 Abnormal weight loss: Secondary | ICD-10-CM | POA: Diagnosis not present

## 2021-04-18 DIAGNOSIS — M5116 Intervertebral disc disorders with radiculopathy, lumbar region: Secondary | ICD-10-CM | POA: Diagnosis not present

## 2021-04-18 DIAGNOSIS — M4316 Spondylolisthesis, lumbar region: Secondary | ICD-10-CM | POA: Diagnosis not present

## 2021-04-18 DIAGNOSIS — M5417 Radiculopathy, lumbosacral region: Secondary | ICD-10-CM | POA: Diagnosis not present

## 2021-05-02 DIAGNOSIS — M5417 Radiculopathy, lumbosacral region: Secondary | ICD-10-CM | POA: Diagnosis not present

## 2021-05-19 DIAGNOSIS — M5416 Radiculopathy, lumbar region: Secondary | ICD-10-CM | POA: Diagnosis not present

## 2021-06-13 DIAGNOSIS — M5416 Radiculopathy, lumbar region: Secondary | ICD-10-CM | POA: Diagnosis not present

## 2021-07-04 DIAGNOSIS — M79672 Pain in left foot: Secondary | ICD-10-CM | POA: Diagnosis not present

## 2021-07-04 DIAGNOSIS — M2042 Other hammer toe(s) (acquired), left foot: Secondary | ICD-10-CM | POA: Diagnosis not present

## 2021-07-27 DIAGNOSIS — M5412 Radiculopathy, cervical region: Secondary | ICD-10-CM | POA: Diagnosis not present

## 2021-07-31 DIAGNOSIS — H2513 Age-related nuclear cataract, bilateral: Secondary | ICD-10-CM | POA: Diagnosis not present

## 2021-08-30 DIAGNOSIS — Z1211 Encounter for screening for malignant neoplasm of colon: Secondary | ICD-10-CM | POA: Diagnosis not present

## 2021-08-30 DIAGNOSIS — M153 Secondary multiple arthritis: Secondary | ICD-10-CM | POA: Diagnosis not present

## 2021-08-30 DIAGNOSIS — Z981 Arthrodesis status: Secondary | ICD-10-CM | POA: Diagnosis not present

## 2021-08-30 DIAGNOSIS — Z23 Encounter for immunization: Secondary | ICD-10-CM | POA: Diagnosis not present

## 2021-08-30 DIAGNOSIS — Z0000 Encounter for general adult medical examination without abnormal findings: Secondary | ICD-10-CM | POA: Diagnosis not present

## 2021-08-30 DIAGNOSIS — Z122 Encounter for screening for malignant neoplasm of respiratory organs: Secondary | ICD-10-CM | POA: Diagnosis not present

## 2021-08-30 DIAGNOSIS — Z136 Encounter for screening for cardiovascular disorders: Secondary | ICD-10-CM | POA: Diagnosis not present

## 2021-08-30 DIAGNOSIS — L409 Psoriasis, unspecified: Secondary | ICD-10-CM | POA: Diagnosis not present

## 2021-09-01 ENCOUNTER — Telehealth (HOSPITAL_BASED_OUTPATIENT_CLINIC_OR_DEPARTMENT_OTHER): Payer: Self-pay

## 2021-09-01 ENCOUNTER — Other Ambulatory Visit (HOSPITAL_BASED_OUTPATIENT_CLINIC_OR_DEPARTMENT_OTHER): Payer: Self-pay | Admitting: Family Medicine

## 2021-09-01 DIAGNOSIS — Z87891 Personal history of nicotine dependence: Secondary | ICD-10-CM

## 2021-09-19 DIAGNOSIS — M5412 Radiculopathy, cervical region: Secondary | ICD-10-CM | POA: Diagnosis not present

## 2021-09-28 DIAGNOSIS — M5412 Radiculopathy, cervical region: Secondary | ICD-10-CM | POA: Diagnosis not present

## 2021-10-02 ENCOUNTER — Ambulatory Visit (HOSPITAL_BASED_OUTPATIENT_CLINIC_OR_DEPARTMENT_OTHER)
Admission: RE | Admit: 2021-10-02 | Discharge: 2021-10-02 | Disposition: A | Discharge status: Home / Self Care | Payer: PPO | Source: Ambulatory Visit | Attending: Family Medicine | Admitting: Family Medicine

## 2021-10-02 ENCOUNTER — Other Ambulatory Visit (HOSPITAL_BASED_OUTPATIENT_CLINIC_OR_DEPARTMENT_OTHER): Payer: Self-pay | Admitting: Family Medicine

## 2021-10-02 DIAGNOSIS — R634 Abnormal weight loss: Secondary | ICD-10-CM | POA: Diagnosis not present

## 2021-10-02 DIAGNOSIS — I700 Atherosclerosis of aorta: Secondary | ICD-10-CM | POA: Diagnosis not present

## 2021-10-02 DIAGNOSIS — Z87891 Personal history of nicotine dependence: Secondary | ICD-10-CM

## 2021-10-02 DIAGNOSIS — K449 Diaphragmatic hernia without obstruction or gangrene: Secondary | ICD-10-CM | POA: Diagnosis not present

## 2021-10-16 ENCOUNTER — Other Ambulatory Visit (HOSPITAL_BASED_OUTPATIENT_CLINIC_OR_DEPARTMENT_OTHER): Payer: Self-pay | Admitting: Family Medicine

## 2021-10-19 DIAGNOSIS — M5417 Radiculopathy, lumbosacral region: Secondary | ICD-10-CM | POA: Diagnosis not present

## 2021-10-19 DIAGNOSIS — M5412 Radiculopathy, cervical region: Secondary | ICD-10-CM | POA: Diagnosis not present

## 2021-10-20 ENCOUNTER — Other Ambulatory Visit (HOSPITAL_BASED_OUTPATIENT_CLINIC_OR_DEPARTMENT_OTHER): Payer: Self-pay | Admitting: Family Medicine

## 2021-10-25 ENCOUNTER — Other Ambulatory Visit (HOSPITAL_BASED_OUTPATIENT_CLINIC_OR_DEPARTMENT_OTHER): Payer: Self-pay | Admitting: Family Medicine

## 2021-10-25 DIAGNOSIS — I77819 Aortic ectasia, unspecified site: Secondary | ICD-10-CM

## 2021-10-25 DIAGNOSIS — M5412 Radiculopathy, cervical region: Secondary | ICD-10-CM | POA: Diagnosis not present

## 2021-10-26 ENCOUNTER — Ambulatory Visit (HOSPITAL_BASED_OUTPATIENT_CLINIC_OR_DEPARTMENT_OTHER)
Admission: RE | Admit: 2021-10-26 | Discharge: 2021-10-26 | Disposition: A | Discharge status: Home / Self Care | Payer: PPO | Source: Ambulatory Visit | Attending: Family Medicine | Admitting: Family Medicine

## 2021-10-26 ENCOUNTER — Encounter (HOSPITAL_BASED_OUTPATIENT_CLINIC_OR_DEPARTMENT_OTHER): Payer: Self-pay

## 2021-10-26 DIAGNOSIS — I77819 Aortic ectasia, unspecified site: Secondary | ICD-10-CM | POA: Insufficient documentation

## 2021-10-26 DIAGNOSIS — K449 Diaphragmatic hernia without obstruction or gangrene: Secondary | ICD-10-CM | POA: Diagnosis not present

## 2021-10-26 DIAGNOSIS — I7121 Aneurysm of the ascending aorta, without rupture: Secondary | ICD-10-CM | POA: Diagnosis not present

## 2021-10-26 MED ORDER — IOHEXOL 350 MG/ML SOLN
100.0000 mL | Freq: Once | INTRAVENOUS | Status: AC | PRN
  Administered 2021-10-26: 100 mL via INTRAVENOUS

## 2022-03-01 DIAGNOSIS — M722 Plantar fascial fibromatosis: Secondary | ICD-10-CM | POA: Diagnosis not present

## 2022-03-01 DIAGNOSIS — M216X1 Other acquired deformities of right foot: Secondary | ICD-10-CM | POA: Diagnosis not present

## 2022-03-01 DIAGNOSIS — M19071 Primary osteoarthritis, right ankle and foot: Secondary | ICD-10-CM | POA: Diagnosis not present

## 2022-03-01 DIAGNOSIS — M21621 Bunionette of right foot: Secondary | ICD-10-CM | POA: Diagnosis not present

## 2022-03-01 DIAGNOSIS — M7751 Other enthesopathy of right foot: Secondary | ICD-10-CM | POA: Diagnosis not present

## 2022-09-19 DIAGNOSIS — I77810 Thoracic aortic ectasia: Secondary | ICD-10-CM | POA: Diagnosis not present

## 2022-09-19 DIAGNOSIS — Z7185 Encounter for immunization safety counseling: Secondary | ICD-10-CM | POA: Diagnosis not present

## 2022-09-19 DIAGNOSIS — Z23 Encounter for immunization: Secondary | ICD-10-CM | POA: Diagnosis not present

## 2022-09-19 DIAGNOSIS — Z862 Personal history of diseases of the blood and blood-forming organs and certain disorders involving the immune mechanism: Secondary | ICD-10-CM | POA: Diagnosis not present

## 2022-09-19 DIAGNOSIS — Z0000 Encounter for general adult medical examination without abnormal findings: Secondary | ICD-10-CM | POA: Diagnosis not present

## 2022-09-19 DIAGNOSIS — L400 Psoriasis vulgaris: Secondary | ICD-10-CM | POA: Diagnosis not present

## 2022-09-19 DIAGNOSIS — M1990 Unspecified osteoarthritis, unspecified site: Secondary | ICD-10-CM | POA: Diagnosis not present

## 2022-09-19 DIAGNOSIS — E782 Mixed hyperlipidemia: Secondary | ICD-10-CM | POA: Diagnosis not present

## 2022-09-19 DIAGNOSIS — Z1211 Encounter for screening for malignant neoplasm of colon: Secondary | ICD-10-CM | POA: Diagnosis not present

## 2022-09-19 DIAGNOSIS — I700 Atherosclerosis of aorta: Secondary | ICD-10-CM | POA: Diagnosis not present

## 2022-09-20 DIAGNOSIS — M5440 Lumbago with sciatica, unspecified side: Secondary | ICD-10-CM | POA: Diagnosis not present

## 2022-09-20 DIAGNOSIS — M5459 Other low back pain: Secondary | ICD-10-CM | POA: Diagnosis not present

## 2022-09-21 ENCOUNTER — Encounter (HOSPITAL_BASED_OUTPATIENT_CLINIC_OR_DEPARTMENT_OTHER): Payer: Self-pay | Admitting: Family Medicine

## 2022-09-27 DIAGNOSIS — M5440 Lumbago with sciatica, unspecified side: Secondary | ICD-10-CM | POA: Diagnosis not present

## 2022-09-27 DIAGNOSIS — M5136 Other intervertebral disc degeneration, lumbar region: Secondary | ICD-10-CM | POA: Diagnosis not present

## 2022-09-27 DIAGNOSIS — M47816 Spondylosis without myelopathy or radiculopathy, lumbar region: Secondary | ICD-10-CM | POA: Diagnosis not present

## 2022-09-27 DIAGNOSIS — M79606 Pain in leg, unspecified: Secondary | ICD-10-CM | POA: Diagnosis not present

## 2022-09-27 DIAGNOSIS — M5137 Other intervertebral disc degeneration, lumbosacral region: Secondary | ICD-10-CM | POA: Diagnosis not present

## 2022-09-28 ENCOUNTER — Encounter (HOSPITAL_BASED_OUTPATIENT_CLINIC_OR_DEPARTMENT_OTHER): Payer: Self-pay | Admitting: Family Medicine

## 2022-09-28 ENCOUNTER — Other Ambulatory Visit (HOSPITAL_BASED_OUTPATIENT_CLINIC_OR_DEPARTMENT_OTHER): Payer: Self-pay | Admitting: Family Medicine

## 2022-09-28 DIAGNOSIS — I77810 Thoracic aortic ectasia: Secondary | ICD-10-CM

## 2022-10-11 DIAGNOSIS — M5440 Lumbago with sciatica, unspecified side: Secondary | ICD-10-CM | POA: Diagnosis not present

## 2022-10-11 DIAGNOSIS — M5459 Other low back pain: Secondary | ICD-10-CM | POA: Diagnosis not present

## 2022-10-22 DIAGNOSIS — M47816 Spondylosis without myelopathy or radiculopathy, lumbar region: Secondary | ICD-10-CM | POA: Diagnosis not present

## 2022-10-26 DIAGNOSIS — M5440 Lumbago with sciatica, unspecified side: Secondary | ICD-10-CM | POA: Diagnosis not present

## 2022-10-30 ENCOUNTER — Encounter (HOSPITAL_BASED_OUTPATIENT_CLINIC_OR_DEPARTMENT_OTHER): Payer: Self-pay

## 2022-10-30 ENCOUNTER — Ambulatory Visit (HOSPITAL_BASED_OUTPATIENT_CLINIC_OR_DEPARTMENT_OTHER)
Admission: RE | Admit: 2022-10-30 | Discharge: 2022-10-30 | Disposition: A | Discharge status: Home / Self Care | Payer: HMO | Source: Ambulatory Visit | Attending: Family Medicine | Admitting: Family Medicine

## 2022-10-30 DIAGNOSIS — I700 Atherosclerosis of aorta: Secondary | ICD-10-CM | POA: Diagnosis not present

## 2022-10-30 DIAGNOSIS — K449 Diaphragmatic hernia without obstruction or gangrene: Secondary | ICD-10-CM | POA: Diagnosis not present

## 2022-10-30 DIAGNOSIS — I7121 Aneurysm of the ascending aorta, without rupture: Secondary | ICD-10-CM | POA: Diagnosis not present

## 2022-10-30 DIAGNOSIS — I77810 Thoracic aortic ectasia: Secondary | ICD-10-CM | POA: Diagnosis not present

## 2022-10-30 MED ORDER — IOHEXOL 350 MG/ML SOLN
100.0000 mL | Freq: Once | INTRAVENOUS | Status: AC | PRN
  Administered 2022-10-30: 100 mL via INTRAVENOUS

## 2022-10-31 ENCOUNTER — Encounter: Payer: Self-pay | Admitting: Physical Therapy

## 2022-10-31 ENCOUNTER — Ambulatory Visit: Payer: HMO | Attending: Neurosurgery | Admitting: Physical Therapy

## 2022-10-31 DIAGNOSIS — M6281 Muscle weakness (generalized): Secondary | ICD-10-CM | POA: Insufficient documentation

## 2022-10-31 DIAGNOSIS — M5459 Other low back pain: Secondary | ICD-10-CM | POA: Insufficient documentation

## 2022-10-31 NOTE — Therapy (Signed)
OUTPATIENT PHYSICAL THERAPY THORACOLUMBAR EVALUATION   Patient Name: Todd Carpenter MRN: [id number] DOB:[birth year], 72 y.o., male Today's Date: 10/31/2022  END OF SESSION:  PT End of Session - 10/31/22 0800     Visit Number 1    Date for PT Re-Evaluation 01/31/23    Authorization Type HTA    PT Start Time 0800    PT Stop Time 0843    PT Time Calculation (min) 43 min    Activity Tolerance Patient tolerated treatment well    Behavior During Therapy [facility] for tasks assessed/performed             Past Medical History:  Diagnosis Date   BENIGN PROSTATIC HYPERTROPHY 09/18/2006   Degenerative disc disease    PSORIASIS 07/14/2007   Past Surgical History:  Procedure Laterality Date   BACK SURGERY  4132,4401   NECK SURGERY  03/16/2010   Patient Active Problem List   Diagnosis Date Noted   Dysentery 02/07/2021   Nocturia 08/29/2020   Hearing loss 08/29/2020   Wrist pain, acute, left 08/28/2019   Acute right-sided low back pain without sciatica 08/28/2019   Acute right ankle pain 07/27/2019   Tick bite 07/27/2019   COVID-19 12/10/2018   Excessive cerumen in both ear canals 02/26/2018   Hyperkalemia 02/26/2018   Family history of coronary artery disease 02/09/2015   Healthcare maintenance 09/18/2011   ERECTILE DYSFUNCTION 02/06/2010   Musculoskeletal disorder 02/06/2010   Psoriasis 07/14/2007   BPH (benign prostatic hyperplasia) 09/18/2006    PCP: Redgie Grayer DO  REFERRING PROVIDER: Wynetta Emery, MD  REFERRING DIAG: low back pain  Rationale for Evaluation and Treatment: Rehabilitation  THERAPY DIAG:  Other low back pain  Muscle weakness (generalized)  ONSET DATE: 10/11/22  SUBJECTIVE:                                                                                                                                                                                           SUBJECTIVE STATEMENT: Patient reports long history of LBP, had a fusion in the past, reports that over the  past two years the pain has gotten worse, worse as the day goes on.  Has had injections in the back that help for short periods.  Had an injection last week and had 4 really good days but now the pain is back with standing, he will have another injection last week  PERTINENT HISTORY:  2012 lumbar fusion  PAIN:  Are you having pain? Yes: NPRS scale: 2/10 Pain location: low back left a little more than the right  Pain description: ache, sharp, spasm Aggravating factors: worse as the day goes on, standing,  vacuum pain up to 8-9/10  Relieving factors: lie down, lumbar support , heat, tylenol pain at best 2/10  PRECAUTIONS: None  RED FLAGS: None   WEIGHT BEARING RESTRICTIONS: No  FALLS:  Has patient fallen in last 6 months? No  LIVING ENVIRONMENT: Lives with: lives with their family and lives with their spouse Lives in: House/apartment Stairs: No Has following equipment at home: None  OCCUPATION: retired  PLOF: Independent and wife is legally blind he does all the cooking, cleaning and shopping, yardwork was playing golf 1x/week  PATIENT GOALS: have less pain  NEXT MD VISIT: next week October 29  OBJECTIVE:  Note: Objective measures were completed at Evaluation unless otherwise noted.  DIAGNOSTIC FINDINGS:  See chart  SCREENING FOR RED FLAGS: Bowel or bladder incontinence: No Spinal tumors: No Cauda equina syndrome: No Compression fracture: No Abdominal aneurysm: No  COGNITION: Overall cognitive status: Within functional limits for tasks assessed     SENSATION: Has some left leg and foot tingling  MUSCLE LENGTH: Mild tightness in the HS and the piriformis  POSTURE: rounded shoulders, forward head, and decreased lumbar lordosis  PALPATION: Tight lumbar with some knots, mild tendernss  LUMBAR ROM:   AROM eval  Flexion Decreased 25%  Extension Decreased 50%  Right lateral flexion Decreased 50%  Left lateral flexion Decreased 50%  Right rotation   Left  rotation    (Blank rows = not tested)  LOWER EXTREMITY ROM:   WNL  LOWER EXTREMITY MMT:    MMT Right eval Left eval  Hip flexion 4- 4-  Hip extension    Hip abduction 4- 4-  Hip adduction    Hip internal rotation    Hip external rotation    Knee flexion 4 4-  Knee extension 4- 4-  Ankle dorsiflexion 4 4  Ankle plantarflexion    Ankle inversion    Ankle eversion     (Blank rows = not tested)  LUMBAR SPECIAL TESTS:  Straight leg raise test: Positive and Slump test: Positive  FUNCTIONAL TESTS:  5 times sit to stand: 27 Timed up and go (TUG): 20  GAIT: Distance walked: 100 feet Assistive device utilized: None Level of assistance: Complete Independence Comments: does steps one at a time right is the stronger leg  TODAY'S TREATMENT:                                                                                                                              DATE:     PATIENT EDUCATION:  Education details: POC/HEP Person educated: Patient Education method: Explanation, Facilities manager, and Handouts Education comprehension: verbalized understanding  HOME EXERCISE PROGRAM: Access Code: [email or web address] URL: [email or web address] Date: 10/31/2022 Prepared by: Stacie Glaze  Exercises - Hooklying Single Knee to Chest Stretch  - 2 x daily - 7 x weekly - 1 sets - 10 reps - 3 hold - Supine Double Knee to Chest Modified  - 2 x daily - 7 x weekly -  1 sets - 10 reps - 3 hold - Supine Lower Trunk Rotation with PLB  - 2 x daily - 7 x weekly - 1 sets - 10 reps - 3 hold - Supine Bridge  - 2 x daily - 7 x weekly - 1 sets - 10 reps - 3 hold - Supine Piriformis Stretch Pulling Heel to Hip  - 2 x daily - 7 x weekly - 1 sets - 10 reps - 10 hold  ASSESSMENT:  CLINICAL IMPRESSION: Patient is a 72 y.o. male who was seen today for physical therapy evaluation and treatment for LBP.  Has a long history of LBP had a 1 level fusion years ago.  Reports that over the past few  months  he has had increased LBP and difficulty walking, stopped playing golf and has a lot of pain by the end of the day.  Had an injection last week that relieved his pain for about 4 days, he will have another injection next week and possible ablation in the future.  We will wait to see him until after the 2nd injection.  Work on core stability and Hospital doctor.   OBJECTIVE IMPAIRMENTS: Abnormal gait, cardiopulmonary status limiting activity, decreased activity tolerance, decreased balance, decreased endurance, decreased mobility, difficulty walking, decreased ROM, decreased strength, increased fascial restrictions, increased muscle spasms, impaired flexibility, improper body mechanics, postural dysfunction, and pain.   REHAB POTENTIAL: Good  CLINICAL DECISION MAKING: Stable/uncomplicated  EVALUATION COMPLEXITY: Low   GOALS: Goals reviewed with patient? Yes  SHORT TERM GOALS: Target date: 11/15/22  Independent with initial HEP Goal status: INITIAL  LONG TERM GOALS: Target date: 01/31/23  Understand posture and body mechanics Goal status: INITIAL  2.  Decrease pain 50% Goal status: INITIAL  3.  Increase lumbar ROM 25% Goal status: INITIAL  4.  Able to go up and down stairs step over step Goal status: INITIAL  5.  Play golf without pain >5/10 Goal status: INITIAL  PLAN:  PT FREQUENCY: 1-2x/week  PT DURATION: 12 weeks  PLANNED INTERVENTIONS: 97164- PT Re-evaluation, 97110-Therapeutic exercises, 97530- Therapeutic activity, 97112- Neuromuscular re-education, 97535- Self Care, 95284- Manual therapy, 97014- Electrical stimulation (unattended), Patient/Family education, Taping, Dry Needling, Spinal mobilization, and Moist heat.  PLAN FOR NEXT SESSION: will see after his second injection, start with posture and body mechanics and core strength   Jaiveon Suppes W, PT 10/31/2022, 8:01 AM

## 2022-11-06 DIAGNOSIS — M47816 Spondylosis without myelopathy or radiculopathy, lumbar region: Secondary | ICD-10-CM | POA: Diagnosis not present

## 2022-11-13 NOTE — Therapy (Signed)
OUTPATIENT PHYSICAL THERAPY THORACOLUMBAR TREATMENT   Patient Name: MASIAH WOODY MRN: [id number] DOB:Apr 20, 1950, 72 y.o., male Today's Date: 11/14/2022  END OF SESSION:  PT End of Session - 11/14/22 0841     Visit Number 2    Date for PT Re-Evaluation 01/31/23    Authorization Type HTA    PT Start Time 0845    PT Stop Time 0930    PT Time Calculation (min) 45 min    Activity Tolerance Patient tolerated treatment well    Behavior During Therapy [facility] for tasks assessed/performed              Past Medical History:  Diagnosis Date   BENIGN PROSTATIC HYPERTROPHY 09/18/2006   Degenerative disc disease    PSORIASIS 07/14/2007   Past Surgical History:  Procedure Laterality Date   BACK SURGERY  4098,1191   NECK SURGERY  03/16/2010   Patient Active Problem List   Diagnosis Date Noted   Dysentery 02/07/2021   Nocturia 08/29/2020   Hearing loss 08/29/2020   Wrist pain, acute, left 08/28/2019   Acute right-sided low back pain without sciatica 08/28/2019   Acute right ankle pain 07/27/2019   Tick bite 07/27/2019   COVID-19 12/10/2018   Excessive cerumen in both ear canals 02/26/2018   Hyperkalemia 02/26/2018   Family history of coronary artery disease 02/09/2015   Healthcare maintenance 09/18/2011   ERECTILE DYSFUNCTION 02/06/2010   Musculoskeletal disorder 02/06/2010   Psoriasis 07/14/2007   BPH (benign prostatic hyperplasia) 09/18/2006    PCP: Redgie Grayer DO  REFERRING PROVIDER: Wynetta Emery, MD  REFERRING DIAG: low back pain  Rationale for Evaluation and Treatment: Rehabilitation  THERAPY DIAG:  Other low back pain  Muscle weakness (generalized)  ONSET DATE: 10/11/22  SUBJECTIVE:                                                                                                                                                                                           SUBJECTIVE STATEMENT: Back hurts on occasion, most days. It hasn't been quite as bad since I had the two  injections. They have helped some, the first one was better than the second.   PERTINENT HISTORY:  2012 lumbar fusion  PAIN:  Are you having pain? Yes: NPRS scale: 2/10 Pain location: low back left a little more than the right  Pain description: ache, sharp, spasm Aggravating factors: worse as the day goes on, standing, vacuum pain up to 8-9/10  Relieving factors: lie down, lumbar support , heat, tylenol pain at best 2/10  PRECAUTIONS: None  RED FLAGS: None   WEIGHT BEARING RESTRICTIONS: No  FALLS:  Has patient  fallen in last 6 months? No  LIVING ENVIRONMENT: Lives with: lives with their family and lives with their spouse Lives in: House/apartment Stairs: No Has following equipment at home: None  OCCUPATION: retired  PLOF: Independent and wife is legally blind he does all the cooking, cleaning and shopping, yardwork was playing golf 1x/week  PATIENT GOALS: have less pain  NEXT MD VISIT: next week October 29  OBJECTIVE:  Note: Objective measures were completed at Evaluation unless otherwise noted.  DIAGNOSTIC FINDINGS:  See chart  SCREENING FOR RED FLAGS: Bowel or bladder incontinence: No Spinal tumors: No Cauda equina syndrome: No Compression fracture: No Abdominal aneurysm: No  COGNITION: Overall cognitive status: Within functional limits for tasks assessed     SENSATION: Has some left leg and foot tingling  MUSCLE LENGTH: Mild tightness in the HS and the piriformis  POSTURE: rounded shoulders, forward head, and decreased lumbar lordosis  PALPATION: Tight lumbar with some knots, mild tendernss  LUMBAR ROM:   AROM eval  Flexion Decreased 25%  Extension Decreased 50%  Right lateral flexion Decreased 50%  Left lateral flexion Decreased 50%  Right rotation   Left rotation    (Blank rows = not tested)  LOWER EXTREMITY ROM:   WNL  LOWER EXTREMITY MMT:    MMT Right eval Left eval  Hip flexion 4- 4-  Hip extension    Hip abduction 4- 4-   Hip adduction    Hip internal rotation    Hip external rotation    Knee flexion 4 4-  Knee extension 4- 4-  Ankle dorsiflexion 4 4  Ankle plantarflexion    Ankle inversion    Ankle eversion     (Blank rows = not tested)  LUMBAR SPECIAL TESTS:  Straight leg raise test: Positive and Slump test: Positive  FUNCTIONAL TESTS:  5 times sit to stand: 27 Timed up and go (TUG): 20  GAIT: Distance walked: 100 feet Assistive device utilized: None Level of assistance: Complete Independence Comments: does steps one at a time right is the stronger leg  TODAY'S TREATMENT:                                                                                                                              DATE:   11/14/22 NuStep L5x42mins Supine stretches- HS, knees to chest, piriformis, trunk rotations  Bridges 2x10 STS 2x10 Shoulder ext 5# x10, 10# x10 AR press 10# 2x10 Leg press 20# 2x10 Cable rows 10# 2x10   PATIENT EDUCATION:  Education details: POC/HEP Person educated: Patient Education method: Programmer, multimedia, Facilities manager, and Handouts Education comprehension: verbalized understanding  HOME EXERCISE PROGRAM: Access Code: [email or web address] URL: [email or web address] Date: 10/31/2022 Prepared by: Stacie Glaze  Exercises - Hooklying Single Knee to Chest Stretch  - 2 x daily - 7 x weekly - 1 sets - 10 reps - 3 hold - Supine Double Knee to Chest Modified  - 2 x daily - 7 x weekly - 1 sets -  10 reps - 3 hold - Supine Lower Trunk Rotation with PLB  - 2 x daily - 7 x weekly - 1 sets - 10 reps - 3 hold - Supine Bridge  - 2 x daily - 7 x weekly - 1 sets - 10 reps - 3 hold - Supine Piriformis Stretch Pulling Heel to Hip  - 2 x daily - 7 x weekly - 1 sets - 10 reps - 10 hold  ASSESSMENT:  CLINICAL IMPRESSION: Patient is a 72 y.o. male who was seen today for physical therapy treatment for LBP.  Reports that the injections have helped some. Has increases in pain depending on his  activity level. Doctor has talked to him about possible ablation in the future. Started gym activities today for core stability, posture, back strengthening and body mechanics. Does well with all interventions.   OBJECTIVE IMPAIRMENTS: Abnormal gait, cardiopulmonary status limiting activity, decreased activity tolerance, decreased balance, decreased endurance, decreased mobility, difficulty walking, decreased ROM, decreased strength, increased fascial restrictions, increased muscle spasms, impaired flexibility, improper body mechanics, postural dysfunction, and pain.   REHAB POTENTIAL: Good  CLINICAL DECISION MAKING: Stable/uncomplicated  EVALUATION COMPLEXITY: Low   GOALS: Goals reviewed with patient? Yes  SHORT TERM GOALS: Target date: 11/15/22  Independent with initial HEP Goal status: INITIAL  LONG TERM GOALS: Target date: 01/31/23  Understand posture and body mechanics Goal status: INITIAL  2.  Decrease pain 50% Goal status: INITIAL  3.  Increase lumbar ROM 25% Goal status: INITIAL  4.  Able to go up and down stairs step over step Goal status: INITIAL  5.  Play golf without pain >5/10 Goal status: INITIAL  PLAN:  PT FREQUENCY: 1-2x/week  PT DURATION: 12 weeks  PLANNED INTERVENTIONS: 97164- PT Re-evaluation, 97110-Therapeutic exercises, 97530- Therapeutic activity, 97112- Neuromuscular re-education, 97535- Self Care, 28413- Manual therapy, 97014- Electrical stimulation (unattended), Patient/Family education, Taping, Dry Needling, Spinal mobilization, and Moist heat.  PLAN FOR NEXT SESSION: continue with posture and body mechanics and core strength   Cassie Freer, PT 11/14/2022, 9:26 AM

## 2022-11-14 ENCOUNTER — Ambulatory Visit: Payer: HMO | Attending: Physical Medicine and Rehabilitation

## 2022-11-14 DIAGNOSIS — M5459 Other low back pain: Secondary | ICD-10-CM | POA: Insufficient documentation

## 2022-11-14 DIAGNOSIS — M6281 Muscle weakness (generalized): Secondary | ICD-10-CM | POA: Insufficient documentation

## 2022-11-19 NOTE — Therapy (Incomplete)
OUTPATIENT PHYSICAL THERAPY THORACOLUMBAR TREATMENT   Patient Name: Todd Carpenter MRN: [id number] DOB:08/15/1950, 72 y.o., male Today's Date: 11/21/2022  END OF SESSION:  PT End of Session - 11/21/22 0841     Visit Number 3    Date for PT Re-Evaluation 01/31/23    Authorization Type HTA    PT Start Time 0844    PT Stop Time 0930    PT Time Calculation (min) 46 min    Activity Tolerance Patient tolerated treatment well    Behavior During Therapy [facility] for tasks assessed/performed               Past Medical History:  Diagnosis Date   BENIGN PROSTATIC HYPERTROPHY 09/18/2006   Degenerative disc disease    PSORIASIS 07/14/2007   Past Surgical History:  Procedure Laterality Date   BACK SURGERY  1027,2536   NECK SURGERY  03/16/2010   Patient Active Problem List   Diagnosis Date Noted   Dysentery 02/07/2021   Nocturia 08/29/2020   Hearing loss 08/29/2020   Wrist pain, acute, left 08/28/2019   Acute right-sided low back pain without sciatica 08/28/2019   Acute right ankle pain 07/27/2019   Tick bite 07/27/2019   COVID-19 12/10/2018   Excessive cerumen in both ear canals 02/26/2018   Hyperkalemia 02/26/2018   Family history of coronary artery disease 02/09/2015   Healthcare maintenance 09/18/2011   ERECTILE DYSFUNCTION 02/06/2010   Musculoskeletal disorder 02/06/2010   Psoriasis 07/14/2007   BPH (benign prostatic hyperplasia) 09/18/2006    PCP: Redgie Grayer DO  REFERRING PROVIDER: Wynetta Emery, MD  REFERRING DIAG: low back pain  Rationale for Evaluation and Treatment: Rehabilitation  THERAPY DIAG:  Other low back pain  Muscle weakness (generalized)  ONSET DATE: 10/11/22  SUBJECTIVE:                                                                                                                                                                                           SUBJECTIVE STATEMENT: Doing pretty good, back is okay. I saw the doctor yesterday. I have an appointment on  Dec 5th to do the ablation.   PERTINENT HISTORY:  2012 lumbar fusion  PAIN:  Are you having pain? Yes: NPRS scale: 2/10 Pain location: low back left a little more than the right  Pain description: ache, sharp, spasm Aggravating factors: worse as the day goes on, standing, vacuum pain up to 8-9/10  Relieving factors: lie down, lumbar support , heat, tylenol pain at best 2/10  PRECAUTIONS: None  RED FLAGS: None   WEIGHT BEARING RESTRICTIONS: No  FALLS:  Has patient fallen in last 6 months? No  LIVING ENVIRONMENT: Lives with: lives with their family and lives with their spouse Lives in: House/apartment Stairs: No Has following equipment at home: None  OCCUPATION: retired  PLOF: Independent and wife is legally blind he does all the cooking, cleaning and shopping, yardwork was playing golf 1x/week  PATIENT GOALS: have less pain  NEXT MD VISIT: next week October 29  OBJECTIVE:  Note: Objective measures were completed at Evaluation unless otherwise noted.  DIAGNOSTIC FINDINGS:  See chart  SCREENING FOR RED FLAGS: Bowel or bladder incontinence: No Spinal tumors: No Cauda equina syndrome: No Compression fracture: No Abdominal aneurysm: No  COGNITION: Overall cognitive status: Within functional limits for tasks assessed     SENSATION: Has some left leg and foot tingling  MUSCLE LENGTH: Mild tightness in the HS and the piriformis  POSTURE: rounded shoulders, forward head, and decreased lumbar lordosis  PALPATION: Tight lumbar with some knots, mild tendernss  LUMBAR ROM:   AROM eval  Flexion Decreased 25%  Extension Decreased 50%  Right lateral flexion Decreased 50%  Left lateral flexion Decreased 50%  Right rotation   Left rotation    (Blank rows = not tested)  LOWER EXTREMITY ROM:   WNL  LOWER EXTREMITY MMT:    MMT Right eval Left eval  Hip flexion 4- 4-  Hip extension    Hip abduction 4- 4-  Hip adduction    Hip internal rotation    Hip  external rotation    Knee flexion 4 4-  Knee extension 4- 4-  Ankle dorsiflexion 4 4  Ankle plantarflexion    Ankle inversion    Ankle eversion     (Blank rows = not tested)  LUMBAR SPECIAL TESTS:  Straight leg raise test: Positive and Slump test: Positive  FUNCTIONAL TESTS:  5 times sit to stand: 27 Timed up and go (TUG): 20  GAIT: Distance walked: 100 feet Assistive device utilized: None Level of assistance: Complete Independence Comments: does steps one at a time right is the stronger leg  TODAY'S TREATMENT:                                                                                                                              DATE:   11/21/22 Bike L2 x79mins  STS with OHP 2x10  Feet on pball rotations, knees to chest, small bridges x10 Seated row 25# x10, 30# x10  Lat pull down 35# 2x10 BlackTB ext 2x10 Calf stretch 15s x3    11/14/22 NuStep L5x59mins Supine stretches- HS, knees to chest, piriformis, trunk rotations  Bridges 2x10 STS 2x10 Shoulder ext 5# x10, 10# x10 AR press 10# 2x10 Leg press 20# 2x10 Cable rows 10# 2x10   PATIENT EDUCATION:  Education details: POC/HEP Person educated: Patient Education method: Programmer, multimedia, Facilities manager, and Handouts Education comprehension: verbalized understanding  HOME EXERCISE PROGRAM: Access Code: [email or web address] URL: [email or web address] Date: 10/31/2022 Prepared by: Stacie Glaze  Exercises - Hooklying Single Knee to Chest Stretch  -  2 x daily - 7 x weekly - 1 sets - 10 reps - 3 hold - Supine Double Knee to Chest Modified  - 2 x daily - 7 x weekly - 1 sets - 10 reps - 3 hold - Supine Lower Trunk Rotation with PLB  - 2 x daily - 7 x weekly - 1 sets - 10 reps - 3 hold - Supine Bridge  - 2 x daily - 7 x weekly - 1 sets - 10 reps - 3 hold - Supine Piriformis Stretch Pulling Heel to Hip  - 2 x daily - 7 x weekly - 1 sets - 10 reps - 10 hold  ASSESSMENT:  CLINICAL IMPRESSION: Patient is a 72 y.o.  male who was seen today for physical therapy treatment for LBP.  Stated he felt some relief after PT last visit, was a little sore but it helped with no pain for a day or so. Reports that he is scheduled for an ablation on Dec 5th. Continued to work on stability, posture, back strengthening and body mechanics. Does well with all strength interventions and reports he likes the back machines to help with lats and rhomboids.   OBJECTIVE IMPAIRMENTS: Abnormal gait, cardiopulmonary status limiting activity, decreased activity tolerance, decreased balance, decreased endurance, decreased mobility, difficulty walking, decreased ROM, decreased strength, increased fascial restrictions, increased muscle spasms, impaired flexibility, improper body mechanics, postural dysfunction, and pain.   REHAB POTENTIAL: Good  CLINICAL DECISION MAKING: Stable/uncomplicated  EVALUATION COMPLEXITY: Low   GOALS: Goals reviewed with patient? Yes  SHORT TERM GOALS: Target date: 11/15/22  Independent with initial HEP Goal status: INITIAL  LONG TERM GOALS: Target date: 01/31/23  Understand posture and body mechanics Goal status: INITIAL  2.  Decrease pain 50% Goal status: INITIAL  3.  Increase lumbar ROM 25% Goal status: INITIAL  4.  Able to go up and down stairs step over step Goal status: INITIAL  5.  Play golf without pain >5/10 Goal status: INITIAL  PLAN:  PT FREQUENCY: 1-2x/week  PT DURATION: 12 weeks  PLANNED INTERVENTIONS: 97164- PT Re-evaluation, 97110-Therapeutic exercises, 97530- Therapeutic activity, 97112- Neuromuscular re-education, 97535- Self Care, 01027- Manual therapy, 97014- Electrical stimulation (unattended), Patient/Family education, Taping, Dry Needling, Spinal mobilization, and Moist heat.  PLAN FOR NEXT SESSION: continue with posture and body mechanics and core strength   Cassie Freer, PT 11/21/2022, 9:28 AM

## 2022-11-20 DIAGNOSIS — M5459 Other low back pain: Secondary | ICD-10-CM | POA: Diagnosis not present

## 2022-11-21 ENCOUNTER — Ambulatory Visit: Payer: HMO

## 2022-11-21 DIAGNOSIS — M5459 Other low back pain: Secondary | ICD-10-CM | POA: Diagnosis not present

## 2022-11-21 DIAGNOSIS — M6281 Muscle weakness (generalized): Secondary | ICD-10-CM

## 2022-11-28 ENCOUNTER — Encounter: Payer: Self-pay | Admitting: Physical Therapy

## 2022-11-28 ENCOUNTER — Ambulatory Visit: Payer: HMO | Admitting: Physical Therapy

## 2022-11-28 DIAGNOSIS — M6281 Muscle weakness (generalized): Secondary | ICD-10-CM

## 2022-11-28 DIAGNOSIS — M5459 Other low back pain: Secondary | ICD-10-CM

## 2022-11-28 NOTE — Therapy (Signed)
OUTPATIENT PHYSICAL THERAPY THORACOLUMBAR TREATMENT   Patient Name: Todd Carpenter MRN: [id number] DOB:July 02, 1950, 72 y.o., male Today's Date: 11/28/2022  END OF SESSION:  PT End of Session - 11/28/22 0843     Visit Number 4    Date for PT Re-Evaluation 01/31/23    Authorization Type HTA    PT Start Time 0843    PT Stop Time 0928    PT Time Calculation (min) 45 min    Activity Tolerance Patient tolerated treatment well    Behavior During Therapy [facility] for tasks assessed/performed               Past Medical History:  Diagnosis Date   BENIGN PROSTATIC HYPERTROPHY 09/18/2006   Degenerative disc disease    PSORIASIS 07/14/2007   Past Surgical History:  Procedure Laterality Date   BACK SURGERY  8657,8469   NECK SURGERY  03/16/2010   Patient Active Problem List   Diagnosis Date Noted   Dysentery 02/07/2021   Nocturia 08/29/2020   Hearing loss 08/29/2020   Wrist pain, acute, left 08/28/2019   Acute right-sided low back pain without sciatica 08/28/2019   Acute right ankle pain 07/27/2019   Tick bite 07/27/2019   COVID-19 12/10/2018   Excessive cerumen in both ear canals 02/26/2018   Hyperkalemia 02/26/2018   Family history of coronary artery disease 02/09/2015   Healthcare maintenance 09/18/2011   ERECTILE DYSFUNCTION 02/06/2010   Musculoskeletal disorder 02/06/2010   Psoriasis 07/14/2007   BPH (benign prostatic hyperplasia) 09/18/2006    PCP: Redgie Grayer DO  REFERRING PROVIDER: Wynetta Emery, MD  REFERRING DIAG: low back pain  Rationale for Evaluation and Treatment: Rehabilitation  THERAPY DIAG:  Other low back pain  Muscle weakness (generalized)  ONSET DATE: 10/11/22  SUBJECTIVE:                                                                                                                                                                                           SUBJECTIVE STATEMENT: Doing pretty good, back is okay. I have an appointment on Dec 5th to do the ablation.  I am really doing a little better since I stopped playing golf.  Does c/o some right knee pain  PERTINENT HISTORY:  2012 lumbar fusion  PAIN:  Are you having pain? Yes: NPRS scale: 2/10 Pain location: low back left a little more than the right  Pain description: ache, sharp, spasm Aggravating factors: worse as the day goes on, standing, vacuum pain up to 8-9/10  Relieving factors: lie down, lumbar support , heat, tylenol pain at best 2/10  PRECAUTIONS: None  RED FLAGS: None   WEIGHT BEARING RESTRICTIONS:  No  FALLS:  Has patient fallen in last 6 months? No  LIVING ENVIRONMENT: Lives with: lives with their family and lives with their spouse Lives in: House/apartment Stairs: No Has following equipment at home: None  OCCUPATION: retired  PLOF: Independent and wife is legally blind he does all the cooking, cleaning and shopping, yardwork was playing golf 1x/week  PATIENT GOALS: have less pain  NEXT MD VISIT: next week October 29  OBJECTIVE:  Note: Objective measures were completed at Evaluation unless otherwise noted.  DIAGNOSTIC FINDINGS:  See chart  SCREENING FOR RED FLAGS: Bowel or bladder incontinence: No Spinal tumors: No Cauda equina syndrome: No Compression fracture: No Abdominal aneurysm: No  COGNITION: Overall cognitive status: Within functional limits for tasks assessed     SENSATION: Has some left leg and foot tingling  MUSCLE LENGTH: Mild tightness in the HS and the piriformis  POSTURE: rounded shoulders, forward head, and decreased lumbar lordosis  PALPATION: Tight lumbar with some knots, mild tendernss  LUMBAR ROM:   AROM eval  Flexion Decreased 25%  Extension Decreased 50%  Right lateral flexion Decreased 50%  Left lateral flexion Decreased 50%  Right rotation   Left rotation    (Blank rows = not tested)  LOWER EXTREMITY ROM:   WNL  LOWER EXTREMITY MMT:    MMT Right eval Left eval  Hip flexion 4- 4-  Hip extension    Hip  abduction 4- 4-  Hip adduction    Hip internal rotation    Hip external rotation    Knee flexion 4 4-  Knee extension 4- 4-  Ankle dorsiflexion 4 4  Ankle plantarflexion    Ankle inversion    Ankle eversion     (Blank rows = not tested)  LUMBAR SPECIAL TESTS:  Straight leg raise test: Positive and Slump test: Positive  FUNCTIONAL TESTS:  5 times sit to stand: 27 Timed up and go (TUG): 20  GAIT: Distance walked: 100 feet Assistive device utilized: None Level of assistance: Complete Independence Comments: does steps one at a time right is the stronger leg  TODAY'S TREATMENT:                                                                                                                              DATE:   11/28/22 Nustep level 5 x 6 minutes Seated row 20# 2x10 Lats 20# 2x10 20# AR press 2x10 5# hip extension  Tried hip abduction but this caused some hip/back pain Education and PT demonstration of posture and body mechanics for foot prop, vacuum, golfer's lift, power lift, weight close, pivot etc,, Talked about if possible and okay from MD safe return and slow return to golf HS and piriformis stretch  11/21/22 Bike L2 x14mins  STS with OHP 2x10  Feet on pball rotations, knees to chest, small bridges x10 Seated row 25# x10, 30# x10  Lat pull down 35# 2x10 BlackTB ext 2x10 Calf stretch 15s x3  11/14/22 NuStep L5x37mins Supine stretches- HS, knees to chest, piriformis, trunk rotations  Bridges 2x10 STS 2x10 Shoulder ext 5# x10, 10# x10 AR press 10# 2x10 Leg press 20# 2x10 Cable rows 10# 2x10   PATIENT EDUCATION:  Education details: POC/HEP Person educated: Patient Education method: Explanation, Facilities manager, and Handouts Education comprehension: verbalized understanding  HOME EXERCISE PROGRAM: Access Code: [email or web address] URL: [email or web address] Date: 10/31/2022 Prepared by: Stacie Glaze  Exercises - Hooklying Single Knee to Chest  Stretch  - 2 x daily - 7 x weekly - 1 sets - 10 reps - 3 hold - Supine Double Knee to Chest Modified  - 2 x daily - 7 x weekly - 1 sets - 10 reps - 3 hold - Supine Lower Trunk Rotation with PLB  - 2 x daily - 7 x weekly - 1 sets - 10 reps - 3 hold - Supine Bridge  - 2 x daily - 7 x weekly - 1 sets - 10 reps - 3 hold - Supine Piriformis Stretch Pulling Heel to Hip  - 2 x daily - 7 x weekly - 1 sets - 10 reps - 10 hold  ASSESSMENT:  CLINICAL IMPRESSION: Patient is a 72 y.o. male who was seen today for physical therapy treatment for LBP.  Stated he felt some relief after PT last visit, was a little sore but it helped with no pain for a day or so. Reports that he is scheduled for an ablation on Dec 5th. Continued to work on stability, posture, back strengthening and added a lot of instruction with body mechanics. Has weak hips, had pain with hip abduction  OBJECTIVE IMPAIRMENTS: Abnormal gait, cardiopulmonary status limiting activity, decreased activity tolerance, decreased balance, decreased endurance, decreased mobility, difficulty walking, decreased ROM, decreased strength, increased fascial restrictions, increased muscle spasms, impaired flexibility, improper body mechanics, postural dysfunction, and pain.   REHAB POTENTIAL: Good  CLINICAL DECISION MAKING: Stable/uncomplicated  EVALUATION COMPLEXITY: Low   GOALS: Goals reviewed with patient? Yes  SHORT TERM GOALS: Target date: 11/15/22  Independent with initial HEP Goal status:met 11/28/22  LONG TERM GOALS: Target date: 01/31/23  Understand posture and body mechanics Goal status: met 11/28/22  2.  Decrease pain 50% Goal status: INITIAL  3.  Increase lumbar ROM 25% Goal status: INITIAL  4.  Able to go up and down stairs step over step Goal status: partially met 11/28/22  5.  Play golf without pain >5/10 Goal status: INITIAL  PLAN:  PT FREQUENCY: 1-2x/week  PT DURATION: 12 weeks  PLANNED INTERVENTIONS: 97164- PT  Re-evaluation, 97110-Therapeutic exercises, 97530- Therapeutic activity, 97112- Neuromuscular re-education, 97535- Self Care, 62130- Manual therapy, 97014- Electrical stimulation (unattended), Patient/Family education, Taping, Dry Needling, Spinal mobilization, and Moist heat.  PLAN FOR NEXT SESSION: Next visit assure posture and body mechanics and HEP, he will need to see if he is supposed to continue PT after the ablation   Sweet Jarvis W, PT 11/28/2022, 8:44 AM

## 2022-12-04 NOTE — Therapy (Signed)
OUTPATIENT PHYSICAL THERAPY THORACOLUMBAR TREATMENT   Patient Name: Todd Carpenter MRN: [id number] DOB:1950-04-06, 72 y.o., male Today's Date: 12/05/2022  END OF SESSION:  PT End of Session - 12/05/22 0838     Visit Number 5    Date for PT Re-Evaluation 01/31/23    Authorization Type HTA    PT Start Time 0840    PT Stop Time 0925    PT Time Calculation (min) 45 min    Activity Tolerance Patient tolerated treatment well    Behavior During Therapy [facility] for tasks assessed/performed                Past Medical History:  Diagnosis Date   BENIGN PROSTATIC HYPERTROPHY 09/18/2006   Degenerative disc disease    PSORIASIS 07/14/2007   Past Surgical History:  Procedure Laterality Date   BACK SURGERY  7829,5621   NECK SURGERY  03/16/2010   Patient Active Problem List   Diagnosis Date Noted   Dysentery 02/07/2021   Nocturia 08/29/2020   Hearing loss 08/29/2020   Wrist pain, acute, left 08/28/2019   Acute right-sided low back pain without sciatica 08/28/2019   Acute right ankle pain 07/27/2019   Tick bite 07/27/2019   COVID-19 12/10/2018   Excessive cerumen in both ear canals 02/26/2018   Hyperkalemia 02/26/2018   Family history of coronary artery disease 02/09/2015   Healthcare maintenance 09/18/2011   ERECTILE DYSFUNCTION 02/06/2010   Musculoskeletal disorder 02/06/2010   Psoriasis 07/14/2007   BPH (benign prostatic hyperplasia) 09/18/2006    PCP: Redgie Grayer DO  REFERRING PROVIDER: Wynetta Emery, MD  REFERRING DIAG: low back pain  Rationale for Evaluation and Treatment: Rehabilitation  THERAPY DIAG:  Other low back pain  Muscle weakness (generalized)  ONSET DATE: 10/11/22  SUBJECTIVE:                                                                                                                                                                                           SUBJECTIVE STATEMENT: Back is better than it was 2 months ago, it is not bad. A little bit stiff at  times.   PERTINENT HISTORY:  2012 lumbar fusion  PAIN:  Are you having pain? Yes: NPRS scale: 2/10 Pain location: low back left a little more than the right  Pain description: ache, sharp, spasm Aggravating factors: worse as the day goes on, standing, vacuum pain up to 8-9/10  Relieving factors: lie down, lumbar support , heat, tylenol pain at best 2/10  PRECAUTIONS: None  RED FLAGS: None   WEIGHT BEARING RESTRICTIONS: No  FALLS:  Has patient fallen in last 6 months? No  LIVING ENVIRONMENT:  Lives with: lives with their family and lives with their spouse Lives in: House/apartment Stairs: No Has following equipment at home: None  OCCUPATION: retired  PLOF: Independent and wife is legally blind he does all the cooking, cleaning and shopping, yardwork was playing golf 1x/week  PATIENT GOALS: have less pain  NEXT MD VISIT: next week October 29  OBJECTIVE:  Note: Objective measures were completed at Evaluation unless otherwise noted.  DIAGNOSTIC FINDINGS:  See chart  SCREENING FOR RED FLAGS: Bowel or bladder incontinence: No Spinal tumors: No Cauda equina syndrome: No Compression fracture: No Abdominal aneurysm: No  COGNITION: Overall cognitive status: Within functional limits for tasks assessed     SENSATION: Has some left leg and foot tingling  MUSCLE LENGTH: Mild tightness in the HS and the piriformis  POSTURE: rounded shoulders, forward head, and decreased lumbar lordosis  PALPATION: Tight lumbar with some knots, mild tendernss  LUMBAR ROM:   AROM eval  Flexion Decreased 25%  Extension Decreased 50%  Right lateral flexion Decreased 50%  Left lateral flexion Decreased 50%  Right rotation   Left rotation    (Blank rows = not tested)  LOWER EXTREMITY ROM:   WNL  LOWER EXTREMITY MMT:    MMT Right eval Left eval  Hip flexion 4- 4-  Hip extension    Hip abduction 4- 4-  Hip adduction    Hip internal rotation    Hip external rotation     Knee flexion 4 4-  Knee extension 4- 4-  Ankle dorsiflexion 4 4  Ankle plantarflexion    Ankle inversion    Ankle eversion     (Blank rows = not tested)  LUMBAR SPECIAL TESTS:  Straight leg raise test: Positive and Slump test: Positive  FUNCTIONAL TESTS:  5 times sit to stand: 27 Timed up and go (TUG): 20  GAIT: Distance walked: 100 feet Assistive device utilized: None Level of assistance: Complete Independence Comments: does steps one at a time right is the stronger leg  TODAY'S TREATMENT:                                                                                                                              DATE:   12/05/22 NuStep L5x80mins  Step ups 6" Seated rows and lats 25# 2x10 Standing shoulder extension 10# 2x10 Ab roll up 2x10 Deadbugs 2x10 Farmer's carry 20# 2 laps each arm AR press 20# 2x10  11/28/22 Nustep level 5 x 6 minutes Seated row 20# 2x10 Lats 20# 2x10 20# AR press 2x10 5# hip extension  Tried hip abduction but this caused some hip/back pain Education and PT demonstration of posture and body mechanics for foot prop, vacuum, golfer's lift, power lift, weight close, pivot etc,, Talked about if possible and okay from MD safe return and slow return to golf HS and piriformis stretch  11/21/22 Bike L2 x65mins  STS with OHP 2x10  Feet on pball rotations, knees to chest, small  bridges x10 Seated row 25# x10, 30# x10  Lat pull down 35# 2x10 BlackTB ext 2x10 Calf stretch 15s x3    11/14/22 NuStep L5x73mins Supine stretches- HS, knees to chest, piriformis, trunk rotations  Bridges 2x10 STS 2x10 Shoulder ext 5# x10, 10# x10 AR press 10# 2x10 Leg press 20# 2x10 Cable rows 10# 2x10   PATIENT EDUCATION:  Education details: POC/HEP Person educated: Patient Education method: Explanation, Facilities manager, and Handouts Education comprehension: verbalized understanding  HOME EXERCISE PROGRAM: Access Code: [email or web address] URL:  [email or web address] Date: 10/31/2022 Prepared by: Stacie Glaze  Exercises - Hooklying Single Knee to Chest Stretch  - 2 x daily - 7 x weekly - 1 sets - 10 reps - 3 hold - Supine Double Knee to Chest Modified  - 2 x daily - 7 x weekly - 1 sets - 10 reps - 3 hold - Supine Lower Trunk Rotation with PLB  - 2 x daily - 7 x weekly - 1 sets - 10 reps - 3 hold - Supine Bridge  - 2 x daily - 7 x weekly - 1 sets - 10 reps - 3 hold - Supine Piriformis Stretch Pulling Heel to Hip  - 2 x daily - 7 x weekly - 1 sets - 10 reps - 10 hold  ASSESSMENT:  CLINICAL IMPRESSION: Patient is a 72 y.o. male who was seen today for physical therapy treatment for LBP.  Stated his back is doing better, does not know if it is from the shots or the exercises that have been helping. Patient says that he is scheduled for an ablation on Dec 5th. Continued to work on stability, posture, back and core strengthening. Has weakness in core as noted with partial sit ups and deabugs. Reports he can feel the farmer's carries in his hips.   OBJECTIVE IMPAIRMENTS: Abnormal gait, cardiopulmonary status limiting activity, decreased activity tolerance, decreased balance, decreased endurance, decreased mobility, difficulty walking, decreased ROM, decreased strength, increased fascial restrictions, increased muscle spasms, impaired flexibility, improper body mechanics, postural dysfunction, and pain.   REHAB POTENTIAL: Good  CLINICAL DECISION MAKING: Stable/uncomplicated  EVALUATION COMPLEXITY: Low   GOALS: Goals reviewed with patient? Yes  SHORT TERM GOALS: Target date: 11/15/22  Independent with initial HEP Goal status:met 11/28/22  LONG TERM GOALS: Target date: 01/31/23  Understand posture and body mechanics Goal status: met 11/28/22  2.  Decrease pain 50% Goal status: INITIAL  3.  Increase lumbar ROM 25% Goal status: INITIAL  4.  Able to go up and down stairs step over step Goal status: partially  met 11/28/22  5.  Play golf without pain >5/10 Goal status: INITIAL  PLAN:  PT FREQUENCY: 1-2x/week  PT DURATION: 12 weeks  PLANNED INTERVENTIONS: 97164- PT Re-evaluation, 97110-Therapeutic exercises, 97530- Therapeutic activity, 97112- Neuromuscular re-education, 97535- Self Care, 27253- Manual therapy, 97014- Electrical stimulation (unattended), Patient/Family education, Taping, Dry Needling, Spinal mobilization, and Moist heat.  PLAN FOR NEXT SESSION: Next visit assure posture and body mechanics and HEP, he will need to see if he is supposed to continue PT after the ablation   [facility], PT 12/05/2022, 9:22 AM

## 2022-12-05 ENCOUNTER — Ambulatory Visit: Payer: HMO

## 2022-12-05 DIAGNOSIS — M6281 Muscle weakness (generalized): Secondary | ICD-10-CM

## 2022-12-05 DIAGNOSIS — M5459 Other low back pain: Secondary | ICD-10-CM | POA: Diagnosis not present

## 2022-12-13 DIAGNOSIS — M47816 Spondylosis without myelopathy or radiculopathy, lumbar region: Secondary | ICD-10-CM | POA: Diagnosis not present

## 2023-01-31 DIAGNOSIS — R310 Gross hematuria: Secondary | ICD-10-CM | POA: Diagnosis not present

## 2023-01-31 DIAGNOSIS — R300 Dysuria: Secondary | ICD-10-CM | POA: Diagnosis not present

## 2023-02-20 DIAGNOSIS — R310 Gross hematuria: Secondary | ICD-10-CM | POA: Diagnosis not present

## 2023-02-20 DIAGNOSIS — K449 Diaphragmatic hernia without obstruction or gangrene: Secondary | ICD-10-CM | POA: Diagnosis not present

## 2023-03-27 DIAGNOSIS — N401 Enlarged prostate with lower urinary tract symptoms: Secondary | ICD-10-CM | POA: Diagnosis not present

## 2023-03-27 DIAGNOSIS — N486 Induration penis plastica: Secondary | ICD-10-CM | POA: Diagnosis not present

## 2023-03-27 DIAGNOSIS — R310 Gross hematuria: Secondary | ICD-10-CM | POA: Diagnosis not present

## 2023-03-27 DIAGNOSIS — R3912 Poor urinary stream: Secondary | ICD-10-CM | POA: Diagnosis not present

## 2023-04-02 DIAGNOSIS — M4313 Spondylolisthesis, cervicothoracic region: Secondary | ICD-10-CM | POA: Diagnosis not present

## 2023-04-02 DIAGNOSIS — M4802 Spinal stenosis, cervical region: Secondary | ICD-10-CM | POA: Diagnosis not present

## 2023-04-02 DIAGNOSIS — M4803 Spinal stenosis, cervicothoracic region: Secondary | ICD-10-CM | POA: Diagnosis not present

## 2023-04-02 DIAGNOSIS — M5412 Radiculopathy, cervical region: Secondary | ICD-10-CM | POA: Diagnosis not present

## 2023-04-11 DIAGNOSIS — M542 Cervicalgia: Secondary | ICD-10-CM | POA: Diagnosis not present

## 2023-04-11 DIAGNOSIS — M5412 Radiculopathy, cervical region: Secondary | ICD-10-CM | POA: Diagnosis not present

## 2023-04-18 DIAGNOSIS — M2042 Other hammer toe(s) (acquired), left foot: Secondary | ICD-10-CM | POA: Diagnosis not present

## 2023-05-02 ENCOUNTER — Encounter: Payer: Self-pay | Admitting: Physical Therapy

## 2023-05-02 ENCOUNTER — Other Ambulatory Visit: Payer: Self-pay

## 2023-05-02 ENCOUNTER — Ambulatory Visit: Attending: Neurosurgery | Admitting: Physical Therapy

## 2023-05-02 DIAGNOSIS — R29898 Other symptoms and signs involving the musculoskeletal system: Secondary | ICD-10-CM | POA: Diagnosis not present

## 2023-05-02 DIAGNOSIS — R293 Abnormal posture: Secondary | ICD-10-CM | POA: Insufficient documentation

## 2023-05-02 DIAGNOSIS — M6281 Muscle weakness (generalized): Secondary | ICD-10-CM | POA: Insufficient documentation

## 2023-05-02 DIAGNOSIS — M5412 Radiculopathy, cervical region: Secondary | ICD-10-CM | POA: Insufficient documentation

## 2023-05-02 NOTE — Therapy (Signed)
 OUTPATIENT PHYSICAL THERAPY CERVICAL EVALUATION   Patient Name: Todd Carpenter MRN: [id number] DOB:Oct 11, 1950, 73 y.o., male Today's Date: 05/02/2023  END OF SESSION:  PT End of Session - 05/02/23 0805     Visit Number 1    Number of Visits 17    Date for PT Re-Evaluation 06/27/23    Authorization Type HTA    Authorization Time Period 05/02/23 to 06/27/23    PT Start Time 0802    PT Stop Time 0843    PT Time Calculation (min) 41 min    Activity Tolerance Patient tolerated treatment well    Behavior During Therapy [facility] for tasks assessed/performed             Past Medical History:  Diagnosis Date   BENIGN PROSTATIC HYPERTROPHY 09/18/2006   Degenerative disc disease    PSORIASIS 07/14/2007   Past Surgical History:  Procedure Laterality Date   BACK SURGERY  9629,5284   NECK SURGERY  03/16/2010   Patient Active Problem List   Diagnosis Date Noted   Dysentery 02/07/2021   Nocturia 08/29/2020   Hearing loss 08/29/2020   Wrist pain, acute, left 08/28/2019   Acute right-sided low back pain without sciatica 08/28/2019   Acute right ankle pain 07/27/2019   Tick bite 07/27/2019   COVID-19 12/10/2018   Excessive cerumen in both ear canals 02/26/2018   Hyperkalemia 02/26/2018   Family history of coronary artery disease 02/09/2015   Healthcare maintenance 09/18/2011   ERECTILE DYSFUNCTION 02/06/2010   Musculoskeletal disorder 02/06/2010   Psoriasis 07/14/2007   BPH (benign prostatic hyperplasia) 09/18/2006    PCP: Dallas Due DO   REFERRING PROVIDER: Gearl Keens, MD  REFERRING DIAG: M54.12 (ICD-10-CM) - Radiculopathy, cervical region  THERAPY DIAG:  Radiculopathy, cervical region  Muscle weakness (generalized)  Abnormal posture  Other symptoms and signs involving the musculoskeletal system  Rationale for Evaluation and Treatment: Rehabilitation  ONSET DATE: chronic   SUBJECTIVE:                                                                                                                                                                                                          SUBJECTIVE STATEMENT:  Had plates put in my neck in 2012, got injections in 2023. Have rods and screws 2012 in my low back as well, also had an ablation in the low back but its starting to wear off. Dr. Lamon Pillow did another MRI and told me that another surgery isn't necessary. I mostly have L sided neck issues, there's kind of a knot in the  muscle beside the spine and its sore to the touch. It cracks when I look down. I have pain coming down the back of my arm and numbness in the last 2 fingers, off and on. I stay busy during the day, pain is better in the morning and then is worst by the night, might be fatigued.    Hand dominance: Right  PERTINENT HISTORY:  See above   PAIN:  Are you having pain? Yes: NPRS scale: 1-2/10 Pain location: L side of neck Pain description: sore to touch, some numbness Aggravating factors: being busy through the day Relieving factors: nothing   PRECAUTIONS: Other: hx of ACDF in 2012  RED FLAGS: None     WEIGHT BEARING RESTRICTIONS: No  FALLS:  Has patient fallen in last 6 months? Yes. Number of falls 1 after feeling weak after kidney surgery   LIVING ENVIRONMENT: Lives with: lives with their spouse Lives in: House/apartment   OCCUPATION: retired- Catering manager for heat/air, used to be in [employer]   PLOF: Independent, Independent with basic ADLs, Independent with gait, and Independent with transfers  PATIENT GOALS: do something about the pain in my neck/arm   NEXT MD VISIT: Dr. Lamon Pillow after PT/June   OBJECTIVE:  Note: Objective measures were completed at Evaluation unless otherwise noted.    PATIENT SURVEYS:   Patient-Specific Activity Scoring Scheme  "0" represents "unable to perform." "10" represents "able to perform at prior level. 0 1 2 3 4 5 6 7 8 9  10 (Date and Score)   Activity Eval     1. Golf  3     2.       3.      4.    5.    Score 3    Total score = sum of the activity scores/number of activities Minimum detectable change (90%CI) for average score = 2 points Minimum detectable change (90%CI) for single activity score = 3 points     COGNITION: Overall cognitive status: Within functional limits for tasks assessed  SENSATION: Some intermittent numbness in 4th and 5th fingers   POSTURE: rounded shoulders, forward head, decreased lumbar lordosis, and decreased thoracic kyphosis  PALPATION: Multiple spasms and trigger points noted L scalenes, levator, paraspinals    CERVICAL ROM:   Active ROM A/PROM (deg) eval  Flexion WNL with audible non-painful popping   Extension Severe limitation (reports baseline since surgery in 2012)  Right lateral flexion Severe limitation   Left lateral flexion Severe limitation   Right rotation Mod limitation   Left rotation Mod limitation    (Blank rows = not tested)  Thoracic AROM: flexion WNL, extension mod limitation, lateral flexion mod flexion B, rotation mod limitation B  UPPER EXTREMITY ROM:  Active ROM Right eval Left eval  Shoulder flexion WNL  WNL   Shoulder extension    Shoulder abduction WNL  WNL   Shoulder adduction    Shoulder extension    Shoulder internal rotation WNL  WNL   Shoulder external rotation WNL  WNL  Elbow flexion    Elbow extension    Wrist flexion    Wrist extension    Wrist ulnar deviation    Wrist radial deviation    Wrist pronation    Wrist supination     (Blank rows = not tested)  UPPER EXTREMITY MMT:  MMT Right eval Left eval  Shoulder flexion 4 4  Shoulder extension    Shoulder abduction 4 4  Shoulder adduction    Shoulder extension  Shoulder internal rotation 5 4  Shoulder external rotation 4+ 4+  Middle trapezius    Lower trapezius    Elbow flexion    Elbow extension    Wrist flexion    Wrist extension    Wrist ulnar deviation    Wrist radial deviation    Wrist pronation    Wrist  supination    Grip strength     (Blank rows = not tested)    TREATMENT DATE:   05/02/23  Eval, POC, HEP   HEP discussion/instruction/practice                                                                                                                                  PATIENT EDUCATION:  Education details: exam findings, POC, HEP  Person educated: Patient Education method: Explanation, Demonstration, and Handouts Education comprehension: verbalized understanding, returned demonstration, and needs further education  HOME EXERCISE PROGRAM: Access Code: [email or web address] URL: [email or web address] Date: 05/02/2023 Prepared by: Terrel Ferries  Exercises - Seated Cervical Rotation AROM  - 2-3 x daily - 7 x weekly - 1 sets - 10 reps - Seated Cervical Sidebending AROM  - 2-3 x daily - 7 x weekly - 1 sets - 10 reps - Seated Cervical Extension AROM  - 2-3 x daily - 7 x weekly - 1 sets - 10 reps - Scapular Retraction with Resistance  - 1 x daily - 7 x weekly - 2 sets - 10 reps - 3 seconds  hold - Shoulder extension with resistance - Neutral  - 1 x daily - 7 x weekly - 2 sets - 10 reps - 3 seconds  hold  ASSESSMENT:  CLINICAL IMPRESSION:  Patient is a 73 y.o. M who was seen today for physical therapy evaluation and treatment for M54.12 (ICD-10-CM) - Radiculopathy, cervical region.  See above for objectives. Will make every attempt to address his concerns with skilled PT.   OBJECTIVE IMPAIRMENTS: decreased ROM, decreased strength, hypomobility, increased fascial restrictions, increased muscle spasms, impaired sensation, impaired UE functional use, improper body mechanics, postural dysfunction, and pain.   ACTIVITY LIMITATIONS: carrying, lifting, reach over head, and hygiene/grooming  PARTICIPATION LIMITATIONS: meal prep, cleaning, laundry, driving, community activity, and yard work  PERSONAL FACTORS: Past/current experiences, Profession, and Time since onset of  injury/illness/exacerbation are also affecting patient's functional outcome.   REHAB POTENTIAL: Good  CLINICAL DECISION MAKING: Stable/uncomplicated  EVALUATION COMPLEXITY: Low   GOALS: Goals reviewed with patient? No  SHORT TERM GOALS: Target date: 05/30/2023    Will be compliant with appropriate progressive HEP  Baseline:  Goal status: INITIAL  2.  Will show improved awareness of posture with all functional tasks  Baseline:  Goal status: INITIAL  3.  Cervical AROM to be no more than Mod limited all planes of motion  Baseline: chronic  Goal status: INITIAL  4.  Thoracic ROM to be no more than Min limited all planes of motion  Baseline:  Goal status: INITIAL    LONG TERM GOALS: Target date: 06/27/2023    MMT to improve by one grade all weak groups  Baseline:  Goal status: INITIAL  2.  Radicular symptoms to have improved by 75% Baseline:  Goal status: INITIAL  3.  Pain in neck to have improved by 75% Baseline:  Goal status: INITIAL  4.  Will be able to play golf  and perform bee keeping duties with minimal difficulty/no increase in pain  Baseline:  Goal status: INITIAL  5.  PSFS to be at least 6 to show improved subjective function  Baseline:  Goal status: INITIAL     PLAN:  PT FREQUENCY: 2x/week  PT DURATION: 8 weeks  PLANNED INTERVENTIONS: 97750- Physical Performance Testing, 97110-Therapeutic exercises, 97530- Therapeutic activity, 97112- Neuromuscular re-education, 97535- Self Care, 16109- Manual therapy, [id number]- Electrical stimulation (unattended), 97035- Ultrasound, 60454- Ionotophoresis 4mg /ml Dexamethasone , Taping, and Dry Needling  PLAN FOR NEXT SESSION: requesting dry needling, postural retraining and strengthening, cervical and thoracic mobility, biomechanics for golf and beekeeping   Terrel Ferries, PT, DPT 05/02/23 8:57 AM

## 2023-05-03 ENCOUNTER — Ambulatory Visit

## 2023-05-14 ENCOUNTER — Encounter: Payer: Self-pay | Admitting: Physical Therapy

## 2023-05-14 ENCOUNTER — Ambulatory Visit: Attending: Neurosurgery | Admitting: Physical Therapy

## 2023-05-14 DIAGNOSIS — M5459 Other low back pain: Secondary | ICD-10-CM | POA: Insufficient documentation

## 2023-05-14 DIAGNOSIS — R293 Abnormal posture: Secondary | ICD-10-CM | POA: Insufficient documentation

## 2023-05-14 DIAGNOSIS — M6281 Muscle weakness (generalized): Secondary | ICD-10-CM | POA: Insufficient documentation

## 2023-05-14 DIAGNOSIS — M5412 Radiculopathy, cervical region: Secondary | ICD-10-CM | POA: Diagnosis not present

## 2023-05-14 NOTE — Therapy (Addendum)
 OUTPATIENT PHYSICAL THERAPY CERVICAL TREATMENT   Patient Name: Todd Carpenter MRN: [id number] DOB:07/31/1950, 73 y.o., male Today's Date: 05/14/2023  END OF SESSION:  PT End of Session - 05/14/23 0759     Visit Number 2    Date for PT Re-Evaluation 06/27/23    PT Start Time 0800    PT Stop Time 0845    PT Time Calculation (min) 45 min    Activity Tolerance Patient tolerated treatment well    Behavior During Therapy [facility] for tasks assessed/performed             Past Medical History:  Diagnosis Date   BENIGN PROSTATIC HYPERTROPHY 09/18/2006   Degenerative disc disease    PSORIASIS 07/14/2007   Past Surgical History:  Procedure Laterality Date   BACK SURGERY  4098,1191   NECK SURGERY  03/16/2010   Patient Active Problem List   Diagnosis Date Noted   Dysentery 02/07/2021   Nocturia 08/29/2020   Hearing loss 08/29/2020   Wrist pain, acute, left 08/28/2019   Acute right-sided low back pain without sciatica 08/28/2019   Acute right ankle pain 07/27/2019   Tick bite 07/27/2019   COVID-19 12/10/2018   Excessive cerumen in both ear canals 02/26/2018   Hyperkalemia 02/26/2018   Family history of coronary artery disease 02/09/2015   Healthcare maintenance 09/18/2011   ERECTILE DYSFUNCTION 02/06/2010   Musculoskeletal disorder 02/06/2010   Psoriasis 07/14/2007   BPH (benign prostatic hyperplasia) 09/18/2006    PCP: Dallas Due DO   REFERRING PROVIDER: Gearl Keens, MD  REFERRING DIAG: [phone] (ICD-10-CM) - Radiculopathy, cervical region  THERAPY DIAG:  Radiculopathy, cervical region  Muscle weakness (generalized)  Abnormal posture  Other low back pain  Rationale for Evaluation and Treatment: Rehabilitation  ONSET DATE: chronic   SUBJECTIVE:                                                                                                                                                                                                         SUBJECTIVE  STATEMENT: Im feeling ok,    Eval, Had plates put in my neck in 2012, got injections in 2023. Have rods and screws 2012 in my low back as well, also had an ablation in the low back but its starting to wear off. Dr. Lamon Pillow did another MRI and told me that another surgery isn't necessary. I mostly have L sided neck issues, there's kind of a knot in the muscle beside the spine and its sore to the touch. It cracks when I look down. I have pain coming  down the back of my arm and numbness in the last 2 fingers, off and on. I stay busy during the day, pain is better in the morning and then is worst by the night, might be fatigued.    Hand dominance: Right  PERTINENT HISTORY:  See above   PAIN:  Are you having pain? Yes: NPRS scale: 2/10 Pain location: L side of neck Pain description: sore to touch, some numbness Aggravating factors: being busy through the day Relieving factors: nothing   PRECAUTIONS: Other: hx of ACDF in 2012  RED FLAGS: None     WEIGHT BEARING RESTRICTIONS: No  FALLS:  Has patient fallen in last 6 months? Yes. Number of falls 1 after feeling weak after kidney surgery   LIVING ENVIRONMENT: Lives with: lives with their spouse Lives in: House/apartment   OCCUPATION: retired- Catering manager for heat/air, used to be in [employer]   PLOF: Independent, Independent with basic ADLs, Independent with gait, and Independent with transfers  PATIENT GOALS: do something about the pain in my neck/arm   NEXT MD VISIT: Dr. Lamon Pillow after PT/June   OBJECTIVE:  Note: Objective measures were completed at Evaluation unless otherwise noted.    PATIENT SURVEYS:   Patient-Specific Activity Scoring Scheme  "0" represents "unable to perform." "10" represents "able to perform at prior level. 0 1 2 3 4 5 6 7 8 9  10 (Date and Score)   Activity Eval     1. Golf  3     2.       3.     4.    5.    Score 3    Total score = sum of the activity scores/number of activities Minimum  detectable change (90%CI) for average score = 2 points Minimum detectable change (90%CI) for single activity score = 3 points     COGNITION: Overall cognitive status: Within functional limits for tasks assessed  SENSATION: Some intermittent numbness in 4th and 5th fingers   POSTURE: rounded shoulders, forward head, decreased lumbar lordosis, and decreased thoracic kyphosis  PALPATION: Multiple spasms and trigger points noted L scalenes, levator, paraspinals    CERVICAL ROM:   Active ROM A/PROM (deg) eval  Flexion WNL with audible non-painful popping   Extension Severe limitation (reports baseline since surgery in 2012)  Right lateral flexion Severe limitation   Left lateral flexion Severe limitation   Right rotation Mod limitation   Left rotation Mod limitation    (Blank rows = not tested)  Thoracic AROM: flexion WNL, extension mod limitation, lateral flexion mod flexion B, rotation mod limitation B  UPPER EXTREMITY ROM:  Active ROM Right eval Left eval  Shoulder flexion WNL  WNL   Shoulder extension    Shoulder abduction WNL  WNL   Shoulder adduction    Shoulder extension    Shoulder internal rotation WNL  WNL   Shoulder external rotation WNL  WNL  Elbow flexion    Elbow extension    Wrist flexion    Wrist extension    Wrist ulnar deviation    Wrist radial deviation    Wrist pronation    Wrist supination     (Blank rows = not tested)  UPPER EXTREMITY MMT:  MMT Right eval Left eval  Shoulder flexion 4 4  Shoulder extension    Shoulder abduction 4 4  Shoulder adduction    Shoulder extension    Shoulder internal rotation 5 4  Shoulder external rotation 4+ 4+  Middle trapezius  Lower trapezius    Elbow flexion    Elbow extension    Wrist flexion    Wrist extension    Wrist ulnar deviation    Wrist radial deviation    Wrist pronation    Wrist supination    Grip strength     (Blank rows = not tested)    TREATMENT DATE:  05/14/23 Trigger  Point Dry Needling  Initial Treatment: Pt instructed on Dry Needling rational, procedures, and possible side effects. Pt instructed to expect mild to moderate muscle soreness later in the day and/or into the next day.  Pt instructed in methods to reduce muscle soreness. Pt instructed to continue prescribed HEP. Because Dry Needling was performed over or adjacent to a lung field, pt was educated on S/S of pneumothorax and to seek immediate medical attention should they occur.  Patient was educated on signs and symptoms of infection and other risk factors and advised to seek medical attention should they occur.  Patient verbalized understanding of these instructions and education.   Patient Verbal Consent Given: Yes Education Handout Provided: Yes Muscles Treated: upper traps, cervical parapsinals Treatment Response/Outcome: LTR's DN performed and documented by Arminda Landmark.,PT   UBE L1 each way STM on C spine and UT Red bad ER 2x10 Green band row 2x10 5# shoulder ext. 2x10  05/02/23  Eval, POC, HEP   HEP discussion/instruction/practice                                                                                                                                  PATIENT EDUCATION:  Education details: exam findings, POC, HEP  Person educated: Patient Education method: Explanation, Demonstration, and Handouts Education comprehension: verbalized understanding, returned demonstration, and needs further education  HOME EXERCISE PROGRAM: Access Code: [email or web address] URL: [email or web address] Date: 05/02/2023 Prepared by: Terrel Ferries  Exercises - Seated Cervical Rotation AROM  - 2-3 x daily - 7 x weekly - 1 sets - 10 reps - Seated Cervical Sidebending AROM  - 2-3 x daily - 7 x weekly - 1 sets - 10 reps - Seated Cervical Extension AROM  - 2-3 x daily - 7 x weekly - 1 sets - 10 reps - Scapular Retraction with Resistance  - 1 x daily - 7 x weekly - 2 sets - 10 reps - 3  seconds  hold - Shoulder extension with resistance - Neutral  - 1 x daily - 7 x weekly - 2 sets - 10 reps - 3 seconds  hold  ASSESSMENT:  CLINICAL IMPRESSION:  Patient is a 73 y.o. M who was seen today for physical therapy  treatment for M54.12 (ICD-10-CM) - Radiculopathy, cervical region.  Lead PT assisted in session providing the Pt with DN. Session then followed up with STM to UT and cervical spin, some tenderness reported with STM. Postural and tactile cues required with ER and shoulder extensions.  Will make every attempt to address his  concerns with skilled PT.   OBJECTIVE IMPAIRMENTS: decreased ROM, decreased strength, hypomobility, increased fascial restrictions, increased muscle spasms, impaired sensation, impaired UE functional use, improper body mechanics, postural dysfunction, and pain.   ACTIVITY LIMITATIONS: carrying, lifting, reach over head, and hygiene/grooming  PARTICIPATION LIMITATIONS: meal prep, cleaning, laundry, driving, community activity, and yard work  PERSONAL FACTORS: Past/current experiences, Profession, and Time since onset of injury/illness/exacerbation are also affecting patient's functional outcome.   REHAB POTENTIAL: Good  CLINICAL DECISION MAKING: Stable/uncomplicated  EVALUATION COMPLEXITY: Low   GOALS: Goals reviewed with patient? No  SHORT TERM GOALS: Target date: 05/30/2023    Will be compliant with appropriate progressive HEP  Baseline:  Goal status: Ongoing 05/14/23  2.  Will show improved awareness of posture with all functional tasks  Baseline:  Goal status: INITIAL  3.  Cervical AROM to be no more than Mod limited all planes of motion  Baseline: chronic  Goal status: INITIAL  4.  Thoracic ROM to be no more than Min limited all planes of motion  Baseline:  Goal status: INITIAL    LONG TERM GOALS: Target date: 06/27/2023    MMT to improve by one grade all weak groups  Baseline:  Goal status: INITIAL  2.  Radicular symptoms  to have improved by 75% Baseline:  Goal status: INITIAL  3.  Pain in neck to have improved by 75% Baseline:  Goal status: INITIAL  4.  Will be able to play golf  and perform bee keeping duties with minimal difficulty/no increase in pain  Baseline:  Goal status: INITIAL  5.  PSFS to be at least 6 to show improved subjective function  Baseline:  Goal status: INITIAL     PLAN:  PT FREQUENCY: 2x/week  PT DURATION: 8 weeks  PLANNED INTERVENTIONS: 97750- Physical Performance Testing, 97110-Therapeutic exercises, 97530- Therapeutic activity, [id number]- Neuromuscular re-education, 97535- Self Care, 16109- Manual therapy, G0283- Electrical stimulation (unattended), 97035- Ultrasound, 60454- Ionotophoresis 4mg /ml Dexamethasone , Taping, and Dry Needling  PLAN FOR NEXT SESSION: requesting dry needling, postural retraining and strengthening, cervical and thoracic mobility, biomechanics for golf and beekeeping   Towanda Fret, PTA 05/14/23 7:59 AM

## 2023-05-14 NOTE — Patient Instructions (Signed)

## 2023-05-17 ENCOUNTER — Encounter: Payer: Self-pay | Admitting: Physical Therapy

## 2023-05-17 ENCOUNTER — Ambulatory Visit: Admitting: Physical Therapy

## 2023-05-17 DIAGNOSIS — R293 Abnormal posture: Secondary | ICD-10-CM

## 2023-05-17 DIAGNOSIS — M5412 Radiculopathy, cervical region: Secondary | ICD-10-CM | POA: Diagnosis not present

## 2023-05-17 DIAGNOSIS — M6281 Muscle weakness (generalized): Secondary | ICD-10-CM

## 2023-05-17 NOTE — Therapy (Signed)
 OUTPATIENT PHYSICAL THERAPY CERVICAL TREATMENT   Patient Name: Todd Carpenter MRN: [id number] DOB:[birth year], 73 y.o., male Today's Date: 05/17/2023  END OF SESSION:  PT End of Session - 05/17/23 0842     Visit Number 3    Date for PT Re-Evaluation 06/27/23    PT Start Time 0843    PT Stop Time 0930    PT Time Calculation (min) 47 min    Activity Tolerance Patient tolerated treatment well    Behavior During Therapy [facility] for tasks assessed/performed             Past Medical History:  Diagnosis Date   BENIGN PROSTATIC HYPERTROPHY 09/18/2006   Degenerative disc disease    PSORIASIS 07/14/2007   Past Surgical History:  Procedure Laterality Date   BACK SURGERY  4098,1191   NECK SURGERY  03/16/2010   Patient Active Problem List   Diagnosis Date Noted   Dysentery 02/07/2021   Nocturia 08/29/2020   Hearing loss 08/29/2020   Wrist pain, acute, left 08/28/2019   Acute right-sided low back pain without sciatica 08/28/2019   Acute right ankle pain 07/27/2019   Tick bite 07/27/2019   COVID-19 12/10/2018   Excessive cerumen in both ear canals 02/26/2018   Hyperkalemia 02/26/2018   Family history of coronary artery disease 02/09/2015   Healthcare maintenance 09/18/2011   ERECTILE DYSFUNCTION 02/06/2010   Musculoskeletal disorder 02/06/2010   Psoriasis 07/14/2007   BPH (benign prostatic hyperplasia) 09/18/2006    PCP: Dallas Due DO   REFERRING PROVIDER: Gearl Keens, MD  REFERRING DIAG: M54.12 (ICD-10-CM) - Radiculopathy, cervical region  THERAPY DIAG:  Radiculopathy, cervical region  Muscle weakness (generalized)  Abnormal posture  Rationale for Evaluation and Treatment: Rehabilitation  ONSET DATE: chronic   SUBJECTIVE:                                                                                                                                                                                                         SUBJECTIVE STATEMENT: Doing all right just  stiff in the shoulders and neck   Eval, Had plates put in my neck in 2012, got injections in 2023. Have rods and screws 2012 in my low back as well, also had an ablation in the low back but its starting to wear off. Dr. Lamon Pillow did another MRI and told me that another surgery isn't necessary. I mostly have L sided neck issues, there's kind of a knot in the muscle beside the spine and its sore to the touch. It cracks when I look down. I have pain  coming down the back of my arm and numbness in the last 2 fingers, off and on. I stay busy during the day, pain is better in the morning and then is worst by the night, might be fatigued.    Hand dominance: Right  PERTINENT HISTORY:  See above   PAIN:  Are you having pain? Yes: NPRS scale: 1/10 Pain location: L side of neck Pain description: sore to touch, some numbness Aggravating factors: being busy through the day Relieving factors: nothing   PRECAUTIONS: Other: hx of ACDF in 2012  RED FLAGS: None     WEIGHT BEARING RESTRICTIONS: No  FALLS:  Has patient fallen in last 6 months? Yes. Number of falls 1 after feeling weak after kidney surgery   LIVING ENVIRONMENT: Lives with: lives with their spouse Lives in: House/apartment   OCCUPATION: retired- Catering manager for heat/air, used to be in [employer]   PLOF: Independent, Independent with basic ADLs, Independent with gait, and Independent with transfers  PATIENT GOALS: do something about the pain in my neck/arm   NEXT MD VISIT: Dr. Lamon Pillow after PT/June   OBJECTIVE:  Note: Objective measures were completed at Evaluation unless otherwise noted.    PATIENT SURVEYS:   Patient-Specific Activity Scoring Scheme  "0" represents "unable to perform." "10" represents "able to perform at prior level. 0 1 2 3 4 5 6 7 8 9  10 (Date and Score)   Activity Eval     1. Golf  3     2.       3.     4.    5.    Score 3    Total score = sum of the activity scores/number of activities Minimum  detectable change (90%CI) for average score = 2 points Minimum detectable change (90%CI) for single activity score = 3 points     COGNITION: Overall cognitive status: Within functional limits for tasks assessed  SENSATION: Some intermittent numbness in 4th and 5th fingers   POSTURE: rounded shoulders, forward head, decreased lumbar lordosis, and decreased thoracic kyphosis  PALPATION: Multiple spasms and trigger points noted L scalenes, levator, paraspinals    CERVICAL ROM:   Active ROM A/PROM (deg) eval  Flexion WNL with audible non-painful popping   Extension Severe limitation (reports baseline since surgery in 2012)  Right lateral flexion Severe limitation   Left lateral flexion Severe limitation   Right rotation Mod limitation   Left rotation Mod limitation    (Blank rows = not tested)  Thoracic AROM: flexion WNL, extension mod limitation, lateral flexion mod flexion B, rotation mod limitation B  UPPER EXTREMITY ROM:  Active ROM Right eval Left eval  Shoulder flexion WNL  WNL   Shoulder extension    Shoulder abduction WNL  WNL   Shoulder adduction    Shoulder extension    Shoulder internal rotation WNL  WNL   Shoulder external rotation WNL  WNL  Elbow flexion    Elbow extension    Wrist flexion    Wrist extension    Wrist ulnar deviation    Wrist radial deviation    Wrist pronation    Wrist supination     (Blank rows = not tested)  UPPER EXTREMITY MMT:  MMT Right eval Left eval  Shoulder flexion 4 4  Shoulder extension    Shoulder abduction 4 4  Shoulder adduction    Shoulder extension    Shoulder internal rotation 5 4  Shoulder external rotation 4+ 4+  Middle trapezius  Lower trapezius    Elbow flexion    Elbow extension    Wrist flexion    Wrist extension    Wrist ulnar deviation    Wrist radial deviation    Wrist pronation    Wrist supination    Grip strength     (Blank rows = not tested)    TREATMENT DATE:  05/17/23 NuStep L4 x  7 min Shoulder Ext 5lb 2x10  Standing rows 10lb 2x10  Horizontal abduction green 2x10 Shoulder ER red 2x10  Cervical retractions 2x10 AAROM Shoulder Flex, Ext, IR up back x10  STM on C spine and UT, Passive stretching to UT and levators  05/14/23 Trigger Point Dry Needling  Initial Treatment: Pt instructed on Dry Needling rational, procedures, and possible side effects. Pt instructed to expect mild to moderate muscle soreness later in the day and/or into the next day.  Pt instructed in methods to reduce muscle soreness. Pt instructed to continue prescribed HEP. Because Dry Needling was performed over or adjacent to a lung field, pt was educated on S/S of pneumothorax and to seek immediate medical attention should they occur.  Patient was educated on signs and symptoms of infection and other risk factors and advised to seek medical attention should they occur.  Patient verbalized understanding of these instructions and education.   Patient Verbal Consent Given: Yes Education Handout Provided: Yes Muscles Treated: upper traps, cervical parapsinals Treatment Response/Outcome: LTR's DN performed and documented by Arminda Landmark.,PT   UBE L1 each way STM on C spine and UT Red bad ER 2x10 Green band row 2x10 5# shoulder ext. 2x10  05/02/23  Eval, POC, HEP   HEP discussion/instruction/practice                                                                                                                                  PATIENT EDUCATION:  Education details: exam findings, POC, HEP  Person educated: Patient Education method: Explanation, Demonstration, and Handouts Education comprehension: verbalized understanding, returned demonstration, and needs further education  HOME EXERCISE PROGRAM: Access Code: [email or web address] URL: [email or web address] Date: 05/02/2023 Prepared by: Terrel Ferries  Exercises - Seated Cervical Rotation AROM  - 2-3 x daily - 7 x weekly - 1  sets - 10 reps - Seated Cervical Sidebending AROM  - 2-3 x daily - 7 x weekly - 1 sets - 10 reps - Seated Cervical Extension AROM  - 2-3 x daily - 7 x weekly - 1 sets - 10 reps - Scapular Retraction with Resistance  - 1 x daily - 7 x weekly - 2 sets - 10 reps - 3 seconds  hold - Shoulder extension with resistance - Neutral  - 1 x daily - 7 x weekly - 2 sets - 10 reps - 3 seconds  hold  ASSESSMENT:  CLINICAL IMPRESSION:  Patient is a 73 y.o. M who was seen today for physical therapy  treatment for M54.12 (  ICD-10-CM) - Radiculopathy, cervical region.  Session continued with STM to UT and cervical spin, some tenderness reported with STM. Postural and tactile cues required with ER,  shoulder extensions, and rows. Muscle fatigue and burning with horizontal abduction. Improved tissue elasticity after STM. Will make every attempt to address his concerns with skilled PT.   OBJECTIVE IMPAIRMENTS: decreased ROM, decreased strength, hypomobility, increased fascial restrictions, increased muscle spasms, impaired sensation, impaired UE functional use, improper body mechanics, postural dysfunction, and pain.   ACTIVITY LIMITATIONS: carrying, lifting, reach over head, and hygiene/grooming  PARTICIPATION LIMITATIONS: meal prep, cleaning, laundry, driving, community activity, and yard work  PERSONAL FACTORS: Past/current experiences, Profession, and Time since onset of injury/illness/exacerbation are also affecting patient's functional outcome.   REHAB POTENTIAL: Good  CLINICAL DECISION MAKING: Stable/uncomplicated  EVALUATION COMPLEXITY: Low   GOALS: Goals reviewed with patient? No  SHORT TERM GOALS: Target date: 05/30/2023    Will be compliant with appropriate progressive HEP  Baseline:  Goal status: Ongoing 05/14/23  2.  Will show improved awareness of posture with all functional tasks  Baseline:  Goal status: INITIAL  3.  Cervical AROM to be no more than Mod limited all planes of motion   Baseline: chronic  Goal status: INITIAL  4.  Thoracic ROM to be no more than Min limited all planes of motion  Baseline:  Goal status: INITIAL    LONG TERM GOALS: Target date: 06/27/2023    MMT to improve by one grade all weak groups  Baseline:  Goal status: INITIAL  2.  Radicular symptoms to have improved by 75% Baseline:  Goal status: INITIAL  3.  Pain in neck to have improved by 75% Baseline:  Goal status: INITIAL  4.  Will be able to play golf  and perform bee keeping duties with minimal difficulty/no increase in pain  Baseline:  Goal status: INITIAL  5.  PSFS to be at least 6 to show improved subjective function  Baseline:  Goal status: INITIAL     PLAN:  PT FREQUENCY: 2x/week  PT DURATION: 8 weeks  PLANNED INTERVENTIONS: 97750- Physical Performance Testing, 97110-Therapeutic exercises, 97530- Therapeutic activity, 97112- Neuromuscular re-education, 97535- Self Care, 82956- Manual therapy, G0283- Electrical stimulation (unattended), 97035- Ultrasound, 21308- Ionotophoresis 4mg /ml Dexamethasone , Taping, and Dry Needling  PLAN FOR NEXT SESSION: requesting dry needling, postural retraining and strengthening, cervical and thoracic mobility, biomechanics for golf and beekeeping   Towanda Fret, PTA 05/17/23 8:43 AM

## 2023-05-21 ENCOUNTER — Ambulatory Visit: Admitting: Physical Therapy

## 2023-05-21 DIAGNOSIS — M5412 Radiculopathy, cervical region: Secondary | ICD-10-CM | POA: Diagnosis not present

## 2023-05-21 DIAGNOSIS — R293 Abnormal posture: Secondary | ICD-10-CM

## 2023-05-21 DIAGNOSIS — M6281 Muscle weakness (generalized): Secondary | ICD-10-CM

## 2023-05-21 NOTE — Therapy (Signed)
 OUTPATIENT PHYSICAL THERAPY CERVICAL TREATMENT   Patient Name: Todd Carpenter MRN: [id number] DOB:1950-11-28, 73 y.o., male Today's Date: 05/21/2023  END OF SESSION:    Past Medical History:  Diagnosis Date   BENIGN PROSTATIC HYPERTROPHY 09/18/2006   Degenerative disc disease    PSORIASIS 07/14/2007   Past Surgical History:  Procedure Laterality Date   BACK SURGERY  9562,1308   NECK SURGERY  03/16/2010   Patient Active Problem List   Diagnosis Date Noted   Dysentery 02/07/2021   Nocturia 08/29/2020   Hearing loss 08/29/2020   Wrist pain, acute, left 08/28/2019   Acute right-sided low back pain without sciatica 08/28/2019   Acute right ankle pain 07/27/2019   Tick bite 07/27/2019   COVID-19 12/10/2018   Excessive cerumen in both ear canals 02/26/2018   Hyperkalemia 02/26/2018   Family history of coronary artery disease 02/09/2015   Healthcare maintenance 09/18/2011   ERECTILE DYSFUNCTION 02/06/2010   Musculoskeletal disorder 02/06/2010   Psoriasis 07/14/2007   BPH (benign prostatic hyperplasia) 09/18/2006    PCP: Dallas Due DO   REFERRING PROVIDER: Gearl Keens, MD  REFERRING DIAG: M54.12 (ICD-10-CM) - Radiculopathy, cervical region  THERAPY DIAG:  No diagnosis found.  Rationale for Evaluation and Treatment: Rehabilitation  ONSET DATE: chronic   SUBJECTIVE:                                                                                                                                                                                                         SUBJECTIVE STATEMENT: Doing okay. I thought I was sent here for DN but only had 1 x.  ( After long discussion pt states he did not like DN-made it worse and he did not want to try again.) Pt states no changes with therapy. Not much on me does not hurt- arthritis.  Eval, Had plates put in my neck in 2012, got injections in 2023. Have rods and screws 2012 in my low back as well, also had an ablation in the low  back but its starting to wear off. Dr. Lamon Pillow did another MRI and told me that another surgery isn't necessary. I mostly have L sided neck issues, there's kind of a knot in the muscle beside the spine and its sore to the touch. It cracks when I look down. I have pain coming down the back of my arm and numbness in the last 2 fingers, off and on. I stay busy during the day, pain is better in the morning and then is worst by the night, might be fatigued.  Hand dominance: Right  PERTINENT HISTORY:  See above   PAIN:  Are you having pain? Yes: NPRS scale: 2/10 Pain location: L side of neck Pain description: sore to touch, some numbness Aggravating factors: being busy through the day Relieving factors: nothing   PRECAUTIONS: Other: hx of ACDF in 2012  RED FLAGS: None     WEIGHT BEARING RESTRICTIONS: No  FALLS:  Has patient fallen in last 6 months? Yes. Number of falls 1 after feeling weak after kidney surgery   LIVING ENVIRONMENT: Lives with: lives with their spouse Lives in: House/apartment   OCCUPATION: retired- Catering manager for heat/air, used to be in [employer]   PLOF: Independent, Independent with basic ADLs, Independent with gait, and Independent with transfers  PATIENT GOALS: do something about the pain in my neck/arm   NEXT MD VISIT: Dr. Lamon Pillow after PT/June   OBJECTIVE:  Note: Objective measures were completed at Evaluation unless otherwise noted.    PATIENT SURVEYS:   Patient-Specific Activity Scoring Scheme  "0" represents "unable to perform." "10" represents "able to perform at prior level. 0 1 2 3 4 5 6 7 8 9  10 (Date and Score)   Activity Eval     1. Golf  3     2.       3.     4.    5.    Score 3    Total score = sum of the activity scores/number of activities Minimum detectable change (90%CI) for average score = 2 points Minimum detectable change (90%CI) for single activity score = 3 points     COGNITION: Overall cognitive status: Within  functional limits for tasks assessed  SENSATION: Some intermittent numbness in 4th and 5th fingers   POSTURE: rounded shoulders, forward head, decreased lumbar lordosis, and decreased thoracic kyphosis  PALPATION: Multiple spasms and trigger points noted L scalenes, levator, paraspinals    CERVICAL ROM:   Active ROM A/PROM (deg) eval  Flexion WNL with audible non-painful popping   Extension Severe limitation (reports baseline since surgery in 2012)  Right lateral flexion Severe limitation   Left lateral flexion Severe limitation   Right rotation Mod limitation   Left rotation Mod limitation    (Blank rows = not tested)  Thoracic AROM: flexion WNL, extension mod limitation, lateral flexion mod flexion B, rotation mod limitation B  UPPER EXTREMITY ROM:  Active ROM Right eval Left eval  Shoulder flexion WNL  WNL   Shoulder extension    Shoulder abduction WNL  WNL   Shoulder adduction    Shoulder extension    Shoulder internal rotation WNL  WNL   Shoulder external rotation WNL  WNL  Elbow flexion    Elbow extension    Wrist flexion    Wrist extension    Wrist ulnar deviation    Wrist radial deviation    Wrist pronation    Wrist supination     (Blank rows = not tested)  UPPER EXTREMITY MMT:  MMT Right eval Left eval  Shoulder flexion 4 4  Shoulder extension    Shoulder abduction 4 4  Shoulder adduction    Shoulder extension    Shoulder internal rotation 5 4  Shoulder external rotation 4+ 4+  Middle trapezius    Lower trapezius    Elbow flexion    Elbow extension    Wrist flexion    Wrist extension    Wrist ulnar deviation    Wrist radial deviation    Wrist pronation  Wrist supination    Grip strength     (Blank rows = not tested)    TREATMENT DATE:   05/21/23 Nustep L 5 Cable pulleys 5# shld ext 2 sets 10 Cable rows 10# 2 sets 10 Horizontal abduction green 10x then diagonals 10 x each Shoulder ER red 2x10  Seated row 20# 2 sets 10 Lat  pull down 20# 2 sets 10 Resisted cerv retraction green tband 2 sets 10 AAROM Shoulder Flex, Ext, IR up back x10 2# wt bar STM on C spine and UT, Passive stretching to UT and levators   05/17/23 NuStep L4 x 7 min Shoulder Ext 5lb 2x10  Standing rows 10lb 2x10  Horizontal abduction green 2x10 Shoulder ER red 2x10  Cervical retractions 2x10 AAROM Shoulder Flex, Ext, IR up back x10  STM on C spine and UT, Passive stretching to UT and levators  05/14/23 Trigger Point Dry Needling  Initial Treatment: Pt instructed on Dry Needling rational, procedures, and possible side effects. Pt instructed to expect mild to moderate muscle soreness later in the day and/or into the next day.  Pt instructed in methods to reduce muscle soreness. Pt instructed to continue prescribed HEP. Because Dry Needling was performed over or adjacent to a lung field, pt was educated on S/S of pneumothorax and to seek immediate medical attention should they occur.  Patient was educated on signs and symptoms of infection and other risk factors and advised to seek medical attention should they occur.  Patient verbalized understanding of these instructions and education.   Patient Verbal Consent Given: Yes Education Handout Provided: Yes Muscles Treated: upper traps, cervical parapsinals Treatment Response/Outcome: LTR's DN performed and documented by Arminda Landmark.,PT   UBE L1 each way STM on C spine and UT Red bad ER 2x10 Green band row 2x10 5# shoulder ext. 2x10  05/02/23  Eval, POC, HEP   HEP discussion/instruction/practice                                                                                                                                  PATIENT EDUCATION:  Education details: exam findings, POC, HEP  Person educated: Patient Education method: Explanation, Demonstration, and Handouts Education comprehension: verbalized understanding, returned demonstration, and needs further education  HOME  EXERCISE PROGRAM: Access Code: [email or web address] URL: [email or web address] Date: 05/02/2023 Prepared by: Terrel Ferries  Exercises - Seated Cervical Rotation AROM  - 2-3 x daily - 7 x weekly - 1 sets - 10 reps - Seated Cervical Sidebending AROM  - 2-3 x daily - 7 x weekly - 1 sets - 10 reps - Seated Cervical Extension AROM  - 2-3 x daily - 7 x weekly - 1 sets - 10 reps - Scapular Retraction with Resistance  - 1 x daily - 7 x weekly - 2 sets - 10 reps - 3 seconds  hold - Shoulder extension with resistance - Neutral  - 1 x daily -  7 x weekly - 2 sets - 10 reps - 3 seconds  hold  ASSESSMENT:  CLINICAL IMPRESSION:  Pt states - no changes with PT and therapy makes him sore for 1-2 days after. Pt states he was sent here for DN but he has only had 1 x and did not think it helped and does not want again. STG assessed and documented. Pt needs postural cuing with ex and educ pt with ex on how strengthening and better posture can decrease strain on spine. OBJECTIVE IMPAIRMENTS: decreased ROM, decreased strength, hypomobility, increased fascial restrictions, increased muscle spasms, impaired sensation, impaired UE functional use, improper body mechanics, postural dysfunction, and pain.   ACTIVITY LIMITATIONS: carrying, lifting, reach over head, and hygiene/grooming  PARTICIPATION LIMITATIONS: meal prep, cleaning, laundry, driving, community activity, and yard work  PERSONAL FACTORS: Past/current experiences, Profession, and Time since onset of injury/illness/exacerbation are also affecting patient's functional outcome.   REHAB POTENTIAL: Good  CLINICAL DECISION MAKING: Stable/uncomplicated  EVALUATION COMPLEXITY: Low   GOALS: Goals reviewed with patient? No  SHORT TERM GOALS: Target date: 05/30/2023    Will be compliant with appropriate progressive HEP  Baseline:  Goal status: Ongoing 05/14/23  MET 05/21/23 for initial HEP  2.  Will show improved awareness of posture with all  functional tasks  Baseline:  Goal status: progressing 05/21/23  3.  Cervical AROM to be no more than Mod limited all planes of motion  Baseline: chronic  Goal status: progressing 05/21/23  4.  Thoracic ROM to be no more than Min limited all planes of motion  Baseline:  Goal status: progressing 05/21/23    LONG TERM GOALS: Target date: 06/27/2023    MMT to improve by one grade all weak groups  Baseline:  Goal status: INITIAL  2.  Radicular symptoms to have improved by 75% Baseline:  Goal status: INITIAL  3.  Pain in neck to have improved by 75% Baseline:  Goal status: INITIAL  4.  Will be able to play golf  and perform bee keeping duties with minimal difficulty/no increase in pain  Baseline:  Goal status: INITIAL  5.  PSFS to be at least 6 to show improved subjective function  Baseline:  Goal status: INITIAL     PLAN:  PT FREQUENCY: 2x/week  PT DURATION: 8 weeks  PLANNED INTERVENTIONS: 97750- Physical Performance Testing, 97110-Therapeutic exercises, 97530- Therapeutic activity, 97112- Neuromuscular re-education, 97535- Self Care, 16109- Manual therapy, G0283- Electrical stimulation (unattended), 97035- Ultrasound, 60454- Ionotophoresis 4mg /ml Dexamethasone , Taping, and Dry Needling  PLAN FOR NEXT SESSION: ask about dry needling again, postural retraining and strengthening, cervical and thoracic mobility, biomechanics for golf and beekeeping        CPatient Details  Name: Todd Carpenter MRN: [id number] Date of Birth: February 12, 1950 Referring Provider:  No ref. provider found  Encounter Date: 05/21/2023   Aquilla Bayley, PTA 05/21/2023, 7:57 AM  [place] [facility] at [facility] [address] [facility] [address] [place], [place], [address] Phone: [phone]   Fax:  [phone]

## 2023-05-23 ENCOUNTER — Ambulatory Visit: Admitting: Physical Therapy

## 2023-05-23 DIAGNOSIS — M5412 Radiculopathy, cervical region: Secondary | ICD-10-CM | POA: Diagnosis not present

## 2023-05-23 DIAGNOSIS — R293 Abnormal posture: Secondary | ICD-10-CM

## 2023-05-23 DIAGNOSIS — M6281 Muscle weakness (generalized): Secondary | ICD-10-CM

## 2023-05-23 NOTE — Therapy (Signed)
 OUTPATIENT PHYSICAL THERAPY CERVICAL TREATMENT   Patient Name: Todd Carpenter MRN: [id number] DOB:June 28, 1950, 73 y.o., male Today's Date: 05/23/2023  END OF SESSION:  PT End of Session - 05/23/23 0756     Visit Number 5    Number of Visits 17    Date for PT Re-Evaluation 06/27/23    Authorization Type HTA    Authorization Time Period 05/02/23 to 06/27/23    PT Start Time 0757    PT Stop Time 0840    PT Time Calculation (min) 43 min              Past Medical History:  Diagnosis Date   BENIGN PROSTATIC HYPERTROPHY 09/18/2006   Degenerative disc disease    PSORIASIS 07/14/2007   Past Surgical History:  Procedure Laterality Date   BACK SURGERY  7829,5621   NECK SURGERY  03/16/2010   Patient Active Problem List   Diagnosis Date Noted   Dysentery 02/07/2021   Nocturia 08/29/2020   Hearing loss 08/29/2020   Wrist pain, acute, left 08/28/2019   Acute right-sided low back pain without sciatica 08/28/2019   Acute right ankle pain 07/27/2019   Tick bite 07/27/2019   COVID-19 12/10/2018   Excessive cerumen in both ear canals 02/26/2018   Hyperkalemia 02/26/2018   Family history of coronary artery disease 02/09/2015   Healthcare maintenance 09/18/2011   ERECTILE DYSFUNCTION 02/06/2010   Musculoskeletal disorder 02/06/2010   Psoriasis 07/14/2007   BPH (benign prostatic hyperplasia) 09/18/2006    PCP: Dallas Due DO   REFERRING PROVIDER: Gearl Keens, MD  REFERRING DIAG: M54.12 (ICD-10-CM) - Radiculopathy, cervical region  THERAPY DIAG:  Radiculopathy, cervical region  Muscle weakness (generalized)  Abnormal posture  Rationale for Evaluation and Treatment: Rehabilitation  ONSET DATE: chronic   SUBJECTIVE:                                                                                                                                                                                                         SUBJECTIVE STATEMENT: Stiff and sore yesterday with damp  weather, it always is like that   Hand dominance: Right  PERTINENT HISTORY:  See above   PAIN:  Are you having pain? Yes: NPRS scale: 2/10 Pain location: L side of neck Pain description: sore to touch, some numbness Aggravating factors: being busy through the day Relieving factors: nothing   PRECAUTIONS: Other: hx of ACDF in 2012  RED FLAGS: None     WEIGHT BEARING RESTRICTIONS: No  FALLS:  Has patient fallen in last 6 months? Yes. Number of  falls 1 after feeling weak after kidney surgery   LIVING ENVIRONMENT: Lives with: lives with their spouse Lives in: House/apartment   OCCUPATION: retired- Catering manager for heat/air, used to be in [employer]   PLOF: Independent, Independent with basic ADLs, Independent with gait, and Independent with transfers  PATIENT GOALS: do something about the pain in my neck/arm   NEXT MD VISIT: Dr. Lamon Pillow after PT/June   OBJECTIVE:  Note: Objective measures were completed at Evaluation unless otherwise noted.    PATIENT SURVEYS:   Patient-Specific Activity Scoring Scheme  "0" represents "unable to perform." "10" represents "able to perform at prior level. 0 1 2 3 4 5 6 7 8 9  10 (Date and Score)   Activity Eval     1. Golf  3     2.       3.     4.    5.    Score 3    Total score = sum of the activity scores/number of activities Minimum detectable change (90%CI) for average score = 2 points Minimum detectable change (90%CI) for single activity score = 3 points     COGNITION: Overall cognitive status: Within functional limits for tasks assessed  SENSATION: Some intermittent numbness in 4th and 5th fingers   POSTURE: rounded shoulders, forward head, decreased lumbar lordosis, and decreased thoracic kyphosis  PALPATION: Multiple spasms and trigger points noted L scalenes, levator, paraspinals    CERVICAL ROM:   Active ROM A/PROM (deg) eval 05/23/23  Flexion WNL with audible non-painful popping  Same as eval  Extension  Severe limitation (reports baseline since surgery in 2012) Limited 50%  Right lateral flexion Severe limitation  Decreased 75%  Left lateral flexion Severe limitation  Decreased 75%  Right rotation Mod limitation  Decreased 25%  Left rotation Mod limitation  Decreased 50%   (Blank rows = not tested)  Thoracic AROM: flexion WNL, extension mod limitation, lateral flexion mod flexion B, rotation mod limitation B  UPPER EXTREMITY ROM:  Active ROM Right eval Left eval  Shoulder flexion WNL  WNL   Shoulder extension    Shoulder abduction WNL  WNL   Shoulder adduction    Shoulder extension    Shoulder internal rotation WNL  WNL   Shoulder external rotation WNL  WNL  Elbow flexion    Elbow extension    Wrist flexion    Wrist extension    Wrist ulnar deviation    Wrist radial deviation    Wrist pronation    Wrist supination     (Blank rows = not tested)  UPPER EXTREMITY MMT:  MMT Right eval Left eval  Shoulder flexion 4 4  Shoulder extension    Shoulder abduction 4 4  Shoulder adduction    Shoulder extension    Shoulder internal rotation 5 4  Shoulder external rotation 4+ 4+  Middle trapezius    Lower trapezius    Elbow flexion    Elbow extension    Wrist flexion    Wrist extension    Wrist ulnar deviation    Wrist radial deviation    Wrist pronation    Wrist supination    Grip strength     (Blank rows = not tested)    TREATMENT DATE:   05/23/23  AROM measurements taken and documented above Nustep L 5 7 min Upright row 20# 2 sets 10 8# shruggs and backward shld rolls 15x each 8# seated rows 2 sets 10 8#standing shld ext 2 sets 10 Horz  ABD green tband 15 x Red tband ER 2 sets 10 Resisted cerv retraction 15 x hold 3 sec with head on ball on wall Seated row 25# 2 sets 10 Lat pull down 25# 2 sets 10 STM on C spine and UT, Passive stretching to UT and levators     05/21/23 Nustep L 5 Cable pulleys 5# shld ext 2 sets 10 Cable rows 10# 2 sets  10 Horizontal abduction green 10x then diagonals 10 x each Shoulder ER red 2x10  Seated row 20# 2 sets 10 Lat pull down 20# 2 sets 10 Resisted cerv retraction green tband 2 sets 10 AAROM Shoulder Flex, Ext, IR up back x10 2# wt bar STM on C spine and UT, Passive stretching to UT and levators   05/17/23 NuStep L4 x 7 min Shoulder Ext 5lb 2x10  Standing rows 10lb 2x10  Horizontal abduction green 2x10 Shoulder ER red 2x10  Cervical retractions 2x10 AAROM Shoulder Flex, Ext, IR up back x10  STM on C spine and UT, Passive stretching to UT and levators  05/14/23 Trigger Point Dry Needling  Initial Treatment: Pt instructed on Dry Needling rational, procedures, and possible side effects. Pt instructed to expect mild to moderate muscle soreness later in the day and/or into the next day.  Pt instructed in methods to reduce muscle soreness. Pt instructed to continue prescribed HEP. Because Dry Needling was performed over or adjacent to a lung field, pt was educated on S/S of pneumothorax and to seek immediate medical attention should they occur.  Patient was educated on signs and symptoms of infection and other risk factors and advised to seek medical attention should they occur.  Patient verbalized understanding of these instructions and education.   Patient Verbal Consent Given: Yes Education Handout Provided: Yes Muscles Treated: upper traps, cervical parapsinals Treatment Response/Outcome: LTR's DN performed and documented by Arminda Landmark.,PT   UBE L1 each way STM on C spine and UT Red bad ER 2x10 Green band row 2x10 5# shoulder ext. 2x10  05/02/23  Eval, POC, HEP   HEP discussion/instruction/practice                                                                                                                                  PATIENT EDUCATION:  Education details: exam findings, POC, HEP  Person educated: Patient Education method: Explanation, Demonstration, and  Handouts Education comprehension: verbalized understanding, returned demonstration, and needs further education  HOME EXERCISE PROGRAM: Access Code: [email or web address] URL: [email or web address] Date: 05/02/2023 Prepared by: Terrel Ferries  Exercises - Seated Cervical Rotation AROM  - 2-3 x daily - 7 x weekly - 1 sets - 10 reps - Seated Cervical Sidebending AROM  - 2-3 x daily - 7 x weekly - 1 sets - 10 reps - Seated Cervical Extension AROM  - 2-3 x daily - 7 x weekly - 1 sets - 10 reps - Scapular Retraction with Resistance  -  1 x daily - 7 x weekly - 2 sets - 10 reps - 3 seconds  hold - Shoulder extension with resistance - Neutral  - 1 x daily - 7 x weekly - 2 sets - 10 reps - 3 seconds  hold  ASSESSMENT:  CLINICAL IMPRESSION:  pt tolerated ex well, with cuing to maintain correct posture and not flex in cerv/thoracic with ex and maintain upright and stab with muslces. Tightness throughout cerv and UT with limited ROM but improved from eval. OBJECTIVE IMPAIRMENTS: decreased ROM, decreased strength, hypomobility, increased fascial restrictions, increased muscle spasms, impaired sensation, impaired UE functional use, improper body mechanics, postural dysfunction, and pain.   ACTIVITY LIMITATIONS: carrying, lifting, reach over head, and hygiene/grooming  PARTICIPATION LIMITATIONS: meal prep, cleaning, laundry, driving, community activity, and yard work  PERSONAL FACTORS: Past/current experiences, Profession, and Time since onset of injury/illness/exacerbation are also affecting patient's functional outcome.   REHAB POTENTIAL: Good  CLINICAL DECISION MAKING: Stable/uncomplicated  EVALUATION COMPLEXITY: Low   GOALS: Goals reviewed with patient? No  SHORT TERM GOALS: Target date: 05/30/2023    Will be compliant with appropriate progressive HEP  Baseline:  Goal status: Ongoing 05/14/23  MET 05/21/23 for initial HEP  2.  Will show improved awareness of posture with all functional  tasks  Baseline:  Goal status: progressing 05/21/23  3.  Cervical AROM to be no more than Mod limited all planes of motion  Baseline: chronic  Goal status: progressing 05/21/23  4.  Thoracic ROM to be no more than Min limited all planes of motion  Baseline:  Goal status: progressing 05/21/23    LONG TERM GOALS: Target date: 06/27/2023    MMT to improve by one grade all weak groups  Baseline:  Goal status: INITIAL  2.  Radicular symptoms to have improved by 75% Baseline:  Goal status: INITIAL  3.  Pain in neck to have improved by 75% Baseline:  Goal status: INITIAL  4.  Will be able to play golf  and perform bee keeping duties with minimal difficulty/no increase in pain  Baseline:  Goal status: INITIAL  5.  PSFS to be at least 6 to show improved subjective function  Baseline:  Goal status: INITIAL     PLAN:  PT FREQUENCY: 2x/week  PT DURATION: 8 weeks  PLANNED INTERVENTIONS: 97750- Physical Performance Testing, 97110-Therapeutic exercises, 97530- Therapeutic activity, 97112- Neuromuscular re-education, 97535- Self Care, 78295- Manual therapy, G0283- Electrical stimulation (unattended), 97035- Ultrasound, 62130- Ionotophoresis 4mg /ml Dexamethasone , Taping, and Dry Needling  PLAN FOR NEXT SESSION: ask about dry needling again, postural retraining and strengthening, cervical and thoracic mobility, biomechanics for golf and beekeeping        CPatient Details  Name: Todd Carpenter MRN: [id number] Date of Birth: 1950-11-25 Referring Provider:  No ref. provider found  Encounter Date: 05/23/2023   Aquilla Bayley, PTA 05/23/2023, 7:59 AM  [facility] at [facility] [address] [facility]. [place], [place], [address] Phone: [phone]   Fax:  [phone] at [facility] [address] [facility] [address]. [place], [place], [address] Phone: [phone]   Fax:  [phone]

## 2023-05-30 ENCOUNTER — Ambulatory Visit: Admitting: Physical Therapy

## 2023-05-30 DIAGNOSIS — M6281 Muscle weakness (generalized): Secondary | ICD-10-CM

## 2023-05-30 DIAGNOSIS — M5412 Radiculopathy, cervical region: Secondary | ICD-10-CM | POA: Diagnosis not present

## 2023-05-30 DIAGNOSIS — R293 Abnormal posture: Secondary | ICD-10-CM

## 2023-05-30 NOTE — Therapy (Signed)
 OUTPATIENT PHYSICAL THERAPY CERVICAL TREATMENT   Patient Name: Todd Carpenter MRN: [id number] DOB:[birth year], 73 y.o., male Today's Date: 05/30/2023  END OF SESSION:  PT End of Session - 05/30/23 1353     Visit Number 6    Number of Visits 17    Date for PT Re-Evaluation 06/27/23    Authorization Type HTA    Authorization Time Period 05/02/23 to 06/27/23    PT Start Time 1355    PT Stop Time 1435    PT Time Calculation (min) 40 min              Past Medical History:  Diagnosis Date   BENIGN PROSTATIC HYPERTROPHY 09/18/2006   Degenerative disc disease    PSORIASIS 07/14/2007   Past Surgical History:  Procedure Laterality Date   BACK SURGERY  7829,5621   NECK SURGERY  03/16/2010   Patient Active Problem List   Diagnosis Date Noted   Dysentery 02/07/2021   Nocturia 08/29/2020   Hearing loss 08/29/2020   Wrist pain, acute, left 08/28/2019   Acute right-sided low back pain without sciatica 08/28/2019   Acute right ankle pain 07/27/2019   Tick bite 07/27/2019   COVID-19 12/10/2018   Excessive cerumen in both ear canals 02/26/2018   Hyperkalemia 02/26/2018   Family history of coronary artery disease 02/09/2015   Healthcare maintenance 09/18/2011   ERECTILE DYSFUNCTION 02/06/2010   Musculoskeletal disorder 02/06/2010   Psoriasis 07/14/2007   BPH (benign prostatic hyperplasia) 09/18/2006    PCP: Dallas Due DO   REFERRING PROVIDER: Gearl Keens, MD  REFERRING DIAG: M54.12 (ICD-10-CM) - Radiculopathy, cervical region  THERAPY DIAG:  Radiculopathy, cervical region  Muscle weakness (generalized)  Abnormal posture  Rationale for Evaluation and Treatment: Rehabilitation  ONSET DATE: chronic   SUBJECTIVE:                                                                                                                                                                                                         SUBJECTIVE STATEMENT: Neck is better 50-60% better. I  think my LB needs another injection. MD June 3rd   Hand dominance: Right  PERTINENT HISTORY:  See above   PAIN:  Are you having pain? Yes: NPRS scale: 2/10 Pain location: L side of neck Pain description: sore to touch, some numbness Aggravating factors: being busy through the day Relieving factors: nothing   PRECAUTIONS: Other: hx of ACDF in 2012  RED FLAGS: None     WEIGHT BEARING RESTRICTIONS: No  FALLS:  Has patient fallen in last 6 months?  Yes. Number of falls 1 after feeling weak after kidney surgery   LIVING ENVIRONMENT: Lives with: lives with their spouse Lives in: House/apartment   OCCUPATION: retired- Catering manager for heat/air, used to be in [employer]   PLOF: Independent, Independent with basic ADLs, Independent with gait, and Independent with transfers  PATIENT GOALS: do something about the pain in my neck/arm   NEXT MD VISIT: Dr. Lamon Pillow after PT/June   OBJECTIVE:  Note: Objective measures were completed at Evaluation unless otherwise noted.    PATIENT SURVEYS:   Patient-Specific Activity Scoring Scheme  "0" represents "unable to perform." "10" represents "able to perform at prior level. 0 1 2 3 4 5 6 7 8 9  10 (Date and Score)   Activity Eval     1. Golf  3     2.       3.     4.    5.    Score 3    Total score = sum of the activity scores/number of activities Minimum detectable change (90%CI) for average score = 2 points Minimum detectable change (90%CI) for single activity score = 3 points     COGNITION: Overall cognitive status: Within functional limits for tasks assessed  SENSATION: Some intermittent numbness in 4th and 5th fingers   POSTURE: rounded shoulders, forward head, decreased lumbar lordosis, and decreased thoracic kyphosis  PALPATION: Multiple spasms and trigger points noted L scalenes, levator, paraspinals    CERVICAL ROM:   Active ROM A/PROM (deg) eval 05/23/23  Flexion WNL with audible non-painful popping  Same as  eval  Extension Severe limitation (reports baseline since surgery in 2012) Limited 50%  Right lateral flexion Severe limitation  Decreased 75%  Left lateral flexion Severe limitation  Decreased 75%  Right rotation Mod limitation  Decreased 25%  Left rotation Mod limitation  Decreased 50%   (Blank rows = not tested)  Thoracic AROM: flexion WNL, extension mod limitation, lateral flexion mod flexion B, rotation mod limitation B  UPPER EXTREMITY ROM:  Active ROM Right eval Left eval  Shoulder flexion WNL  WNL   Shoulder extension    Shoulder abduction WNL  WNL   Shoulder adduction    Shoulder extension    Shoulder internal rotation WNL  WNL   Shoulder external rotation WNL  WNL  Elbow flexion    Elbow extension    Wrist flexion    Wrist extension    Wrist ulnar deviation    Wrist radial deviation    Wrist pronation    Wrist supination     (Blank rows = not tested)  UPPER EXTREMITY MMT:  MMT Right eval Left eval  Shoulder flexion 4 4  Shoulder extension    Shoulder abduction 4 4  Shoulder adduction    Shoulder extension    Shoulder internal rotation 5 4  Shoulder external rotation 4+ 4+  Middle trapezius    Lower trapezius    Elbow flexion    Elbow extension    Wrist flexion    Wrist extension    Wrist ulnar deviation    Wrist radial deviation    Wrist pronation    Wrist supination    Grip strength     (Blank rows = not tested)    TREATMENT DATE:   05/30/23 Nustep L 5 7# shruggs, backward rolls and shld ext 2 sets 10 3# shld flex into abd 10 x then reverse 10 x Cable pulley rotation 10# 15 X each side Cable pulley AR  press 10# 15 x each side Seated row 25# 2 sets 10 Lat pull down 25# 2 sets 10 Horz ABD green tband 15 x Red tband ER 2 sets 10 STM on C spine and UT, Passive stretching to UT and levators    05/23/23  AROM measurements taken and documented above Nustep L 5 7 min Upright row 20# 2 sets 10 8# shruggs and backward shld rolls 15x  each 8# seated rows 2 sets 10 8#standing shld ext 2 sets 10 Horz ABD green tband 15 x Red tband ER 2 sets 10 Resisted cerv retraction 15 x hold 3 sec with head on ball on wall Seated row 25# 2 sets 10 Lat pull down 25# 2 sets 10 STM on C spine and UT, Passive stretching to UT and levators     05/21/23 Nustep L 5 Cable pulleys 5# shld ext 2 sets 10 Cable rows 10# 2 sets 10 Horizontal abduction green 10x then diagonals 10 x each Shoulder ER red 2x10  Seated row 20# 2 sets 10 Lat pull down 20# 2 sets 10 Resisted cerv retraction green tband 2 sets 10 AAROM Shoulder Flex, Ext, IR up back x10 2# wt bar STM on C spine and UT, Passive stretching to UT and levators   05/17/23 NuStep L4 x 7 min Shoulder Ext 5lb 2x10  Standing rows 10lb 2x10  Horizontal abduction green 2x10 Shoulder ER red 2x10  Cervical retractions 2x10 AAROM Shoulder Flex, Ext, IR up back x10  STM on C spine and UT, Passive stretching to UT and levators  05/14/23 Trigger Point Dry Needling  Initial Treatment: Pt instructed on Dry Needling rational, procedures, and possible side effects. Pt instructed to expect mild to moderate muscle soreness later in the day and/or into the next day.  Pt instructed in methods to reduce muscle soreness. Pt instructed to continue prescribed HEP. Because Dry Needling was performed over or adjacent to a lung field, pt was educated on S/S of pneumothorax and to seek immediate medical attention should they occur.  Patient was educated on signs and symptoms of infection and other risk factors and advised to seek medical attention should they occur.  Patient verbalized understanding of these instructions and education.   Patient Verbal Consent Given: Yes Education Handout Provided: Yes Muscles Treated: upper traps, cervical parapsinals Treatment Response/Outcome: LTR's DN performed and documented by Arminda Landmark.,PT   UBE L1 each way STM on C spine and UT Red bad ER  2x10 Green band row 2x10 5# shoulder ext. 2x10  05/02/23  Eval, POC, HEP   HEP discussion/instruction/practice                                                                                                                                  PATIENT EDUCATION:  Education details: exam findings, POC, HEP  Person educated: Patient Education method: Explanation, Demonstration, and Handouts Education comprehension: verbalized understanding, returned demonstration, and  needs further education  HOME EXERCISE PROGRAM: Access Code: [email or web address] URL: [email or web address] Date: 05/02/2023 Prepared by: Terrel Ferries  Exercises - Seated Cervical Rotation AROM  - 2-3 x daily - 7 x weekly - 1 sets - 10 reps - Seated Cervical Sidebending AROM  - 2-3 x daily - 7 x weekly - 1 sets - 10 reps - Seated Cervical Extension AROM  - 2-3 x daily - 7 x weekly - 1 sets - 10 reps - Scapular Retraction with Resistance  - 1 x daily - 7 x weekly - 2 sets - 10 reps - 3 seconds  hold - Shoulder extension with resistance - Neutral  - 1 x daily - 7 x weekly - 2 sets - 10 reps - 3 seconds  hold  ASSESSMENT:  CLINICAL IMPRESSION:  pt arrives stating he is better, neck 50-60% better. He thinks PT is helping but he has not golfed in 2 weeks so that may be helping too. No changes in AROM. Goals assessed and documented. Progressed strengthening with postural cuing. Less tightness noted today with STW    OBJECTIVE IMPAIRMENTS: decreased ROM, decreased strength, hypomobility, increased fascial restrictions, increased muscle spasms, impaired sensation, impaired UE functional use, improper body mechanics, postural dysfunction, and pain.   ACTIVITY LIMITATIONS: carrying, lifting, reach over head, and hygiene/grooming  PARTICIPATION LIMITATIONS: meal prep, cleaning, laundry, driving, community activity, and yard work  PERSONAL FACTORS: Past/current experiences, Profession, and Time since onset of  injury/illness/exacerbation are also affecting patient's functional outcome.   REHAB POTENTIAL: Good  CLINICAL DECISION MAKING: Stable/uncomplicated  EVALUATION COMPLEXITY: Low   GOALS: Goals reviewed with patient? No  SHORT TERM GOALS: Target date: 05/30/2023    Will be compliant with appropriate progressive HEP  Baseline:  Goal status: Ongoing 05/14/23  MET 05/21/23 for initial HEP  2.  Will show improved awareness of posture with all functional tasks  Baseline:  Goal status: progressing 05/21/23  and 05/30/23  3.  Cervical AROM to be no more than Mod limited all planes of motion  Baseline: chronic  Goal status: progressing 05/21/23 and 05/30/23  4.  Thoracic ROM to be no more than Min limited all planes of motion  Baseline:  Goal status: progressing 05/21/23    LONG TERM GOALS: Target date: 06/27/2023    MMT to improve by one grade all weak groups  Baseline:  Goal status: INITIAL  2.  Radicular symptoms to have improved by 75% Baseline:  Goal status: INITIAL  3.  Pain in neck to have improved by 75% Baseline:  Goal status: INITIAL  4.  Will be able to play golf  and perform bee keeping duties with minimal difficulty/no increase in pain  Baseline:  Goal status: INITIAL  5.  PSFS to be at least 6 to show improved subjective function  Baseline:  Goal status: INITIAL     PLAN:  PT FREQUENCY: 2x/week  PT DURATION: 8 weeks  PLANNED INTERVENTIONS: 97750- Physical Performance Testing, 97110-Therapeutic exercises, 97530- Therapeutic activity, [id number]- Neuromuscular re-education, 97535- Self Care, 16109- Manual therapy, [id number]- Electrical stimulation (unattended), 97035- Ultrasound, 60454- Ionotophoresis 4mg /ml Dexamethasone , Taping, and Dry Needling  PLAN FOR NEXT SESSION: Pt would like to try DN again when therapist available. postural retraining and strengthening, cervical and thoracic mobility, biomechanics for golf and beekeeping        CPatient Details   Name: Todd Carpenter MRN: [id number] Date of Birth: [birth year] Referring Provider:  No ref. provider found  Encounter Date: 05/30/2023   Aquilla Bayley, PTA 05/30/2023,  1:54 PM  [facility] at [facility] [address] [place], [place], [address] Phone: [phone]   Fax:  [phone] at [facility] [address] [place], [place], [address] Phone: [phone]   Fax:  [phone] [facility] at [facility] [address] [place], [place], [address] Phone: [phone]   Fax:  [phone]

## 2023-06-04 ENCOUNTER — Ambulatory Visit: Admitting: Physical Therapy

## 2023-06-04 DIAGNOSIS — R293 Abnormal posture: Secondary | ICD-10-CM

## 2023-06-04 DIAGNOSIS — M6281 Muscle weakness (generalized): Secondary | ICD-10-CM

## 2023-06-04 DIAGNOSIS — M5412 Radiculopathy, cervical region: Secondary | ICD-10-CM | POA: Diagnosis not present

## 2023-06-04 NOTE — Therapy (Signed)
 OUTPATIENT PHYSICAL THERAPY CERVICAL TREATMENT   Patient Name: Todd Carpenter MRN: [id number] DOB:30-Oct-1950, 73 y.o., male Today's Date: 06/04/2023  END OF SESSION:  PT End of Session - 06/04/23 0922     Visit Number 7    Number of Visits 17    Date for PT Re-Evaluation 06/27/23    Authorization Type HTA    Authorization Time Period 05/02/23 to 06/27/23    PT Start Time 0925    PT Stop Time 1010    PT Time Calculation (min) 45 min              Past Medical History:  Diagnosis Date   BENIGN PROSTATIC HYPERTROPHY 09/18/2006   Degenerative disc disease    PSORIASIS 07/14/2007   Past Surgical History:  Procedure Laterality Date   BACK SURGERY  4098,1191   NECK SURGERY  03/16/2010   Patient Active Problem List   Diagnosis Date Noted   Dysentery 02/07/2021   Nocturia 08/29/2020   Hearing loss 08/29/2020   Wrist pain, acute, left 08/28/2019   Acute right-sided low back pain without sciatica 08/28/2019   Acute right ankle pain 07/27/2019   Tick bite 07/27/2019   COVID-19 12/10/2018   Excessive cerumen in both ear canals 02/26/2018   Hyperkalemia 02/26/2018   Family history of coronary artery disease 02/09/2015   Healthcare maintenance 09/18/2011   ERECTILE DYSFUNCTION 02/06/2010   Musculoskeletal disorder 02/06/2010   Psoriasis 07/14/2007   BPH (benign prostatic hyperplasia) 09/18/2006    PCP: Dallas Due DO   REFERRING PROVIDER: Gearl Keens, MD  REFERRING DIAG: M54.12 (ICD-10-CM) - Radiculopathy, cervical region  THERAPY DIAG:  Radiculopathy, cervical region  Muscle weakness (generalized)  Abnormal posture  Rationale for Evaluation and Treatment: Rehabilitation  ONSET DATE: chronic   SUBJECTIVE:                                                                                                                                                                                                         SUBJECTIVE STATEMENT: Doing okay. Okay to try DN  again   Hand dominance: Right  PERTINENT HISTORY:  See above   PAIN:  Are you having pain? Yes: NPRS scale: 2/10 Pain location: L side of neck Pain description: sore to touch, some numbness Aggravating factors: being busy through the day Relieving factors: nothing   PRECAUTIONS: Other: hx of ACDF in 2012  RED FLAGS: None     WEIGHT BEARING RESTRICTIONS: No  FALLS:  Has patient fallen in last 6 months? Yes. Number of falls 1 after feeling weak  after kidney surgery   LIVING ENVIRONMENT: Lives with: lives with their spouse Lives in: House/apartment   OCCUPATION: retired- Catering manager for heat/air, used to be in [employer]   PLOF: Independent, Independent with basic ADLs, Independent with gait, and Independent with transfers  PATIENT GOALS: do something about the pain in my neck/arm   NEXT MD VISIT: Dr. Lamon Pillow after PT/June   OBJECTIVE:  Note: Objective measures were completed at Evaluation unless otherwise noted.    PATIENT SURVEYS:   Patient-Specific Activity Scoring Scheme  "0" represents "unable to perform." "10" represents "able to perform at prior level. 0 1 2 3 4 5 6 7 8 9  10 (Date and Score)   Activity Eval     1. Golf  3     2.       3.     4.    5.    Score 3    Total score = sum of the activity scores/number of activities Minimum detectable change (90%CI) for average score = 2 points Minimum detectable change (90%CI) for single activity score = 3 points     COGNITION: Overall cognitive status: Within functional limits for tasks assessed  SENSATION: Some intermittent numbness in 4th and 5th fingers   POSTURE: rounded shoulders, forward head, decreased lumbar lordosis, and decreased thoracic kyphosis  PALPATION: Multiple spasms and trigger points noted L scalenes, levator, paraspinals    CERVICAL ROM:   Active ROM A/PROM (deg) eval 05/23/23  Flexion WNL with audible non-painful popping  Same as eval  Extension Severe limitation (reports  baseline since surgery in 2012) Limited 50%  Right lateral flexion Severe limitation  Decreased 75%  Left lateral flexion Severe limitation  Decreased 75%  Right rotation Mod limitation  Decreased 25%  Left rotation Mod limitation  Decreased 50%   (Blank rows = not tested)  Thoracic AROM: flexion WNL, extension mod limitation, lateral flexion mod flexion B, rotation mod limitation B  UPPER EXTREMITY ROM:  Active ROM Right eval Left eval  Shoulder flexion WNL  WNL   Shoulder extension    Shoulder abduction WNL  WNL   Shoulder adduction    Shoulder extension    Shoulder internal rotation WNL  WNL   Shoulder external rotation WNL  WNL  Elbow flexion    Elbow extension    Wrist flexion    Wrist extension    Wrist ulnar deviation    Wrist radial deviation    Wrist pronation    Wrist supination     (Blank rows = not tested)  UPPER EXTREMITY MMT:  MMT Right eval Left eval  Shoulder flexion 4 4  Shoulder extension    Shoulder abduction 4 4  Shoulder adduction    Shoulder extension    Shoulder internal rotation 5 4  Shoulder external rotation 4+ 4+  Middle trapezius    Lower trapezius    Elbow flexion    Elbow extension    Wrist flexion    Wrist extension    Wrist ulnar deviation    Wrist radial deviation    Wrist pronation    Wrist supination    Grip strength     (Blank rows = not tested)    TREATMENT DATE:   06/04/23 Nustep L 5 Seated row 25# 2 sets 10 Lat pull down 25# 2 sets 10 Horz ABD green tband 15 x Green  tband ER 15x Cable pulleys shld ext 15# 2 sets 10 3# 4 pt rhy stab 10 x each  15# cable pulleys rotation for golf Wt golf swings STM on C spine and UT, Passive stretching to UT and levators Trigger Point Dry Needling  Subsequent Treatment: Instructions provided previously at initial dry needling treatment.   Patient Verbal Consent Given: Yes Education Handout Provided: Previously Provided Muscles Treated: upper traps Treatment  Response/Outcome: LTR's DN provided and documented by Arminda Landmark, PT     05/30/23 Nustep L 5 7# shruggs, backward rolls and shld ext 2 sets 10 3# shld flex into abd 10 x then reverse 10 x Cable pulley rotation 10# 15 X each side Cable pulley AR press 10# 15 x each side Seated row 25# 2 sets 10 Lat pull down 25# 2 sets 10 Horz ABD green tband 15 x Red tband ER 2 sets 10 STM on C spine and UT, Passive stretching to UT and levators    05/23/23  AROM measurements taken and documented above Nustep L 5 7 min Upright row 20# 2 sets 10 8# shruggs and backward shld rolls 15x each 8# seated rows 2 sets 10 8#standing shld ext 2 sets 10 Horz ABD green tband 15 x Red tband ER 2 sets 10 Resisted cerv retraction 15 x hold 3 sec with head on ball on wall Seated row 25# 2 sets 10 Lat pull down 25# 2 sets 10 STM on C spine and UT, Passive stretching to UT and levators     05/21/23 Nustep L 5 Cable pulleys 5# shld ext 2 sets 10 Cable rows 10# 2 sets 10 Horizontal abduction green 10x then diagonals 10 x each Shoulder ER red 2x10  Seated row 20# 2 sets 10 Lat pull down 20# 2 sets 10 Resisted cerv retraction green tband 2 sets 10 AAROM Shoulder Flex, Ext, IR up back x10 2# wt bar STM on C spine and UT, Passive stretching to UT and levators   05/17/23 NuStep L4 x 7 min Shoulder Ext 5lb 2x10  Standing rows 10lb 2x10  Horizontal abduction green 2x10 Shoulder ER red 2x10  Cervical retractions 2x10 AAROM Shoulder Flex, Ext, IR up back x10  STM on C spine and UT, Passive stretching to UT and levators  05/14/23 Trigger Point Dry Needling  Initial Treatment: Pt instructed on Dry Needling rational, procedures, and possible side effects. Pt instructed to expect mild to moderate muscle soreness later in the day and/or into the next day.  Pt instructed in methods to reduce muscle soreness. Pt instructed to continue prescribed HEP. Because Dry Needling was performed over or  adjacent to a lung field, pt was educated on S/S of pneumothorax and to seek immediate medical attention should they occur.  Patient was educated on signs and symptoms of infection and other risk factors and advised to seek medical attention should they occur.  Patient verbalized understanding of these instructions and education.   Patient Verbal Consent Given: Yes Education Handout Provided: Yes Muscles Treated: upper traps, cervical parapsinals Treatment Response/Outcome: LTR's DN performed and documented by Arminda Landmark.,PT   UBE L1 each way STM on C spine and UT Red bad ER 2x10 Green band row 2x10 5# shoulder ext. 2x10  05/02/23  Eval, POC, HEP   HEP discussion/instruction/practice  PATIENT EDUCATION:  Education details: exam findings, POC, HEP  Person educated: Patient Education method: Explanation, Demonstration, and Handouts Education comprehension: verbalized understanding, returned demonstration, and needs further education  HOME EXERCISE PROGRAM: Access Code: [email or web address] URL: [email or web address] Date: 05/02/2023 Prepared by: Terrel Ferries  Exercises - Seated Cervical Rotation AROM  - 2-3 x daily - 7 x weekly - 1 sets - 10 reps - Seated Cervical Sidebending AROM  - 2-3 x daily - 7 x weekly - 1 sets - 10 reps - Seated Cervical Extension AROM  - 2-3 x daily - 7 x weekly - 1 sets - 10 reps - Scapular Retraction with Resistance  - 1 x daily - 7 x weekly - 2 sets - 10 reps - 3 seconds  hold - Shoulder extension with resistance - Neutral  - 1 x daily - 7 x weekly - 2 sets - 10 reps - 3 seconds  hold  ASSESSMENT:  CLINICAL IMPRESSION:  pt arrived feeling better overall but has not golfed so unsure how that will feel. Added rotational ex and wt goff swing and he had better mvmt and no pain with swing.Pt agreed to try DN again and  said it was not as bad as first time- just tight.  Goals assessed and documented.  OBJECTIVE IMPAIRMENTS: decreased ROM, decreased strength, hypomobility, increased fascial restrictions, increased muscle spasms, impaired sensation, impaired UE functional use, improper body mechanics, postural dysfunction, and pain.   ACTIVITY LIMITATIONS: carrying, lifting, reach over head, and hygiene/grooming  PARTICIPATION LIMITATIONS: meal prep, cleaning, laundry, driving, community activity, and yard work  PERSONAL FACTORS: Past/current experiences, Profession, and Time since onset of injury/illness/exacerbation are also affecting patient's functional outcome.   REHAB POTENTIAL: Good  CLINICAL DECISION MAKING: Stable/uncomplicated  EVALUATION COMPLEXITY: Low   GOALS: Goals reviewed with patient? No  SHORT TERM GOALS: Target date: 05/30/2023    Will be compliant with appropriate progressive HEP  Baseline:  Goal status: Ongoing 05/14/23  MET 05/21/23 for initial HEP  2.  Will show improved awareness of posture with all functional tasks  Baseline:  Goal status: progressing 05/21/23  and 05/30/23 MET 5/27 not consistant  3.  Cervical AROM to be no more than Mod limited all planes of motion  Baseline: chronic  Goal status: progressing 05/21/23 and 05/30/23 and 06/04/23  4.  Thoracic ROM to be no more than Min limited all planes of motion  Baseline:  Goal status: progressing 05/21/23  MET 06/04/23    LONG TERM GOALS: Target date: 06/27/2023    MMT to improve by one grade all weak groups  Baseline:  Goal status: 06/04/23 MET  2.  Radicular symptoms to have improved by 75% Baseline:  Goal status:progressing 06/04/23  3.  Pain in neck to have improved by 75% Baseline:  Goal status: progressing 06/04/23  4.  Will be able to play golf  and perform bee keeping duties with minimal difficulty/no increase in pain  Baseline:  Goal status:not met 06/04/23  5.  PSFS to be at least 6 to show improved  subjective function  Baseline:  Goal status: INITIAL     PLAN:  PT FREQUENCY: 2x/week  PT DURATION: 8 weeks  PLANNED INTERVENTIONS: 97750- Physical Performance Testing, 97110-Therapeutic exercises, 97530- Therapeutic activity, [id number]- Neuromuscular re-education, 97535- Self Care, 60454- Manual therapy, [id number]- Electrical stimulation (unattended), 97035- Ultrasound, 09811- Ionotophoresis 4mg /ml Dexamethasone , Taping, and Dry Needling  PLAN FOR NEXT SESSION: MD next week    CPatient Details  Name: Todd Carpenter MRN: [id number] Date  of Birth: 03-Oct-1950 Referring Provider:  No ref. provider found  Encounter Date: 06/04/2023   Fayetta Hoose 06/04/2023, 9:22 AM  [facility] at [facility] [address] [place], [place], [address] Phone: [phone]   Fax:  [phone] at [facility] [address] [place], [place], [address] Phone: [phone]   Fax:  [phone] [facility] at [facility] [address] [place], [place], [address] Phone: [phone]   Fax:  [phone] [facility] Rehabilitation at [facility] [address] [place], [place], [address] Phone: [phone]   Fax:  [phone]  Patient Details  Name: Todd Carpenter MRN: [id number] Date of Birth: [birth year] Referring Provider:  No ref. provider found  Encounter Date: 06/04/2023   Fayetta Hoose 06/04/2023, 9:22 AM  [facility] Out[facility] Rehabilitation at [facility] [address] [facility] [place], [place], [address] Phone: [phone]   Fax:  [phone]

## 2023-06-06 ENCOUNTER — Ambulatory Visit: Admitting: Physical Therapy

## 2023-06-06 ENCOUNTER — Encounter: Payer: Self-pay | Admitting: Physical Therapy

## 2023-06-06 DIAGNOSIS — M5412 Radiculopathy, cervical region: Secondary | ICD-10-CM

## 2023-06-06 DIAGNOSIS — M5459 Other low back pain: Secondary | ICD-10-CM

## 2023-06-06 DIAGNOSIS — R293 Abnormal posture: Secondary | ICD-10-CM

## 2023-06-06 DIAGNOSIS — M6281 Muscle weakness (generalized): Secondary | ICD-10-CM

## 2023-06-06 NOTE — Therapy (Signed)
 OUTPATIENT PHYSICAL THERAPY CERVICAL TREATMENT   Patient Name: KOREY PRASHAD MRN: [id number] DOB:01-03-1951, 73 y.o., male Today's Date: 06/06/2023  END OF SESSION:  PT End of Session - 06/06/23 1348     Visit Number 8    Date for PT Re-Evaluation 06/27/23    PT Start Time 1345    PT Stop Time 1430    PT Time Calculation (min) 45 min    Activity Tolerance Patient tolerated treatment well    Behavior During Therapy [facility] for tasks assessed/performed              Past Medical History:  Diagnosis Date   BENIGN PROSTATIC HYPERTROPHY 09/18/2006   Degenerative disc disease    PSORIASIS 07/14/2007   Past Surgical History:  Procedure Laterality Date   BACK SURGERY  [id number]   NECK SURGERY  03/16/2010   Patient Active Problem List   Diagnosis Date Noted   Dysentery 02/07/2021   Nocturia 08/29/2020   Hearing loss 08/29/2020   Wrist pain, acute, left 08/28/2019   Acute right-sided low back pain without sciatica 08/28/2019   Acute right ankle pain 07/27/2019   Tick bite 07/27/2019   COVID-19 12/10/2018   Excessive cerumen in both ear canals 02/26/2018   Hyperkalemia 02/26/2018   Family history of coronary artery disease 02/09/2015   Healthcare maintenance 09/18/2011   ERECTILE DYSFUNCTION 02/06/2010   Musculoskeletal disorder 02/06/2010   Psoriasis 07/14/2007   BPH (benign prostatic hyperplasia) 09/18/2006    PCP: Dallas Due DO   REFERRING PROVIDER: Gearl Keens, MD  REFERRING DIAG: [phone] (ICD-10-CM) - Radiculopathy, cervical region  THERAPY DIAG:  Radiculopathy, cervical region  Muscle weakness (generalized)  Other low back pain  Abnormal posture  Rationale for Evaluation and Treatment: Rehabilitation  ONSET DATE: chronic   SUBJECTIVE:                                                                                                                                                                                                         SUBJECTIVE  STATEMENT: Doing good, took a "pain away" pill    Hand dominance: Right  PERTINENT HISTORY:  See above   PAIN:  Are you having pain? Yes: NPRS scale: 0/10 Pain location: L side of neck Pain description: sore to touch, some numbness Aggravating factors: being busy through the day Relieving factors: nothing   PRECAUTIONS: Other: hx of ACDF in 2012  RED FLAGS: None     WEIGHT BEARING RESTRICTIONS: No  FALLS:  Has patient fallen in last 6 months? Yes. Number of falls 1  after feeling weak after kidney surgery   LIVING ENVIRONMENT: Lives with: lives with their spouse Lives in: House/apartment   OCCUPATION: retired- Catering manager for heat/air, used to be in [employer]   PLOF: Independent, Independent with basic ADLs, Independent with gait, and Independent with transfers  PATIENT GOALS: do something about the pain in my neck/arm   NEXT MD VISIT: Dr. Lamon Pillow after PT/June   OBJECTIVE:  Note: Objective measures were completed at Evaluation unless otherwise noted.    PATIENT SURVEYS:   Patient-Specific Activity Scoring Scheme  "0" represents "unable to perform." "10" represents "able to perform at prior level. 0 1 2 3 4 5 6 7 8 9  10 (Date and Score)   Activity Eval     1. Golf  3     2.       3.     4.    5.    Score 3    Total score = sum of the activity scores/number of activities Minimum detectable change (90%CI) for average score = 2 points Minimum detectable change (90%CI) for single activity score = 3 points     COGNITION: Overall cognitive status: Within functional limits for tasks assessed  SENSATION: Some intermittent numbness in 4th and 5th fingers   POSTURE: rounded shoulders, forward head, decreased lumbar lordosis, and decreased thoracic kyphosis  PALPATION: Multiple spasms and trigger points noted L scalenes, levator, paraspinals    CERVICAL ROM:   Active ROM A/PROM (deg) eval 05/23/23  Flexion WNL with audible non-painful popping  Same as  eval  Extension Severe limitation (reports baseline since surgery in 2012) Limited 50%  Right lateral flexion Severe limitation  Decreased 75%  Left lateral flexion Severe limitation  Decreased 75%  Right rotation Mod limitation  Decreased 25%  Left rotation Mod limitation  Decreased 50%   (Blank rows = not tested)  Thoracic AROM: flexion WNL, extension mod limitation, lateral flexion mod flexion B, rotation mod limitation B  UPPER EXTREMITY ROM:  Active ROM Right eval Left eval  Shoulder flexion WNL  WNL   Shoulder extension    Shoulder abduction WNL  WNL   Shoulder adduction    Shoulder extension    Shoulder internal rotation WNL  WNL   Shoulder external rotation WNL  WNL  Elbow flexion    Elbow extension    Wrist flexion    Wrist extension    Wrist ulnar deviation    Wrist radial deviation    Wrist pronation    Wrist supination     (Blank rows = not tested)  UPPER EXTREMITY MMT:  MMT Right eval Left eval  Shoulder flexion 4 4  Shoulder extension    Shoulder abduction 4 4  Shoulder adduction    Shoulder extension    Shoulder internal rotation 5 4  Shoulder external rotation 4+ 4+  Middle trapezius    Lower trapezius    Elbow flexion    Elbow extension    Wrist flexion    Wrist extension    Wrist ulnar deviation    Wrist radial deviation    Wrist pronation    Wrist supination    Grip strength     (Blank rows = not tested)    TREATMENT DATE:  06/06/23 Nustep L 5 Seated row 25# 2 sets 10 Lat pull down 25# 2 sets 10 Shoulder Ext 5lb 2x10 Horiz abd green 2x10  Shoulder Er green 2x10 STM to UT and cervical spine  Cervical ROM  06/04/23 Nustep  L 5 Seated row 25# 2 sets 10 Lat pull down 25# 2 sets 10 Horz ABD green tband 15 x Green  tband ER 15x Cable pulleys shld ext 15# 2 sets 10 3# 4 pt rhy stab 10 x each 15# cable pulleys rotation for golf Wt golf swings STM on C spine and UT, Passive stretching to UT and levators Trigger Point  Dry Needling  Subsequent Treatment: Instructions provided previously at initial dry needling treatment.   Patient Verbal Consent Given: Yes Education Handout Provided: Previously Provided Muscles Treated: upper traps Treatment Response/Outcome: LTR's DN provided and documented by Arminda Landmark, PT     05/30/23 Nustep L 5 7# shruggs, backward rolls and shld ext 2 sets 10 3# shld flex into abd 10 x then reverse 10 x Cable pulley rotation 10# 15 X each side Cable pulley AR press 10# 15 x each side Seated row 25# 2 sets 10 Lat pull down 25# 2 sets 10 Horz ABD green tband 15 x Red tband ER 2 sets 10 STM on C spine and UT, Passive stretching to UT and levators    05/23/23  AROM measurements taken and documented above Nustep L 5 7 min Upright row 20# 2 sets 10 8# shruggs and backward shld rolls 15x each 8# seated rows 2 sets 10 8#standing shld ext 2 sets 10 Horz ABD green tband 15 x Red tband ER 2 sets 10 Resisted cerv retraction 15 x hold 3 sec with head on ball on wall Seated row 25# 2 sets 10 Lat pull down 25# 2 sets 10 STM on C spine and UT, Passive stretching to UT and levators     05/21/23 Nustep L 5 Cable pulleys 5# shld ext 2 sets 10 Cable rows 10# 2 sets 10 Horizontal abduction green 10x then diagonals 10 x each Shoulder ER red 2x10  Seated row 20# 2 sets 10 Lat pull down 20# 2 sets 10 Resisted cerv retraction green tband 2 sets 10 AAROM Shoulder Flex, Ext, IR up back x10 2# wt bar STM on C spine and UT, Passive stretching to UT and levators   05/17/23 NuStep L4 x 7 min Shoulder Ext 5lb 2x10  Standing rows 10lb 2x10  Horizontal abduction green 2x10 Shoulder ER red 2x10  Cervical retractions 2x10 AAROM Shoulder Flex, Ext, IR up back x10  STM on C spine and UT, Passive stretching to UT and levators  05/14/23 Trigger Point Dry Needling  Initial Treatment: Pt instructed on Dry Needling rational, procedures, and possible side effects. Pt instructed  to expect mild to moderate muscle soreness later in the day and/or into the next day.  Pt instructed in methods to reduce muscle soreness. Pt instructed to continue prescribed HEP. Because Dry Needling was performed over or adjacent to a lung field, pt was educated on S/S of pneumothorax and to seek immediate medical attention should they occur.  Patient was educated on signs and symptoms of infection and other risk factors and advised to seek medical attention should they occur.  Patient verbalized understanding of these instructions and education.   Patient Verbal Consent Given: Yes Education Handout Provided: Yes Muscles Treated: upper traps, cervical parapsinals Treatment Response/Outcome: LTR's DN performed and documented by Arminda Landmark.,PT   UBE L1 each way STM on C spine and UT Red bad ER 2x10 Green band row 2x10 5# shoulder ext. 2x10  05/02/23  Eval, POC, HEP   HEP discussion/instruction/practice  PATIENT EDUCATION:  Education details: exam findings, POC, HEP  Person educated: Patient Education method: Explanation, Demonstration, and Handouts Education comprehension: verbalized understanding, returned demonstration, and needs further education  HOME EXERCISE PROGRAM: Access Code: [email or web address] URL: [email or web address] Date: 05/02/2023 Prepared by: Terrel Ferries  Exercises - Seated Cervical Rotation AROM  - 2-3 x daily - 7 x weekly - 1 sets - 10 reps - Seated Cervical Sidebending AROM  - 2-3 x daily - 7 x weekly - 1 sets - 10 reps - Seated Cervical Extension AROM  - 2-3 x daily - 7 x weekly - 1 sets - 10 reps - Scapular Retraction with Resistance  - 1 x daily - 7 x weekly - 2 sets - 10 reps - 3 seconds  hold - Shoulder extension with resistance - Neutral  - 1 x daily - 7 x weekly - 2 sets - 10 reps - 3 seconds   hold  ASSESSMENT:  CLINICAL IMPRESSION:  Pt continues to report improvement with decrease cervical pain. Pt with forward head and rounded shoulders at rest. Improved tissues density overall noted with both UT. Postural cues needed with horizontal abduction and ER. No reports of pain reported during session.  OBJECTIVE IMPAIRMENTS: decreased ROM, decreased strength, hypomobility, increased fascial restrictions, increased muscle spasms, impaired sensation, impaired UE functional use, improper body mechanics, postural dysfunction, and pain.   ACTIVITY LIMITATIONS: carrying, lifting, reach over head, and hygiene/grooming  PARTICIPATION LIMITATIONS: meal prep, cleaning, laundry, driving, community activity, and yard work  PERSONAL FACTORS: Past/current experiences, Profession, and Time since onset of injury/illness/exacerbation are also affecting patient's functional outcome.   REHAB POTENTIAL: Good  CLINICAL DECISION MAKING: Stable/uncomplicated  EVALUATION COMPLEXITY: Low   GOALS: Goals reviewed with patient? No  SHORT TERM GOALS: Target date: 05/30/2023    Will be compliant with appropriate progressive HEP  Baseline:  Goal status: Ongoing 05/14/23  MET 05/21/23 for initial HEP  2.  Will show improved awareness of posture with all functional tasks  Baseline:  Goal status: progressing 05/21/23  and 05/30/23 MET 5/27 not consistant  3.  Cervical AROM to be no more than Mod limited all planes of motion  Baseline: chronic  Goal status: progressing 05/21/23 and 05/30/23 and 06/04/23  4.  Thoracic ROM to be no more than Min limited all planes of motion  Baseline:  Goal status: progressing 05/21/23  MET 06/04/23    LONG TERM GOALS: Target date: 06/27/2023    MMT to improve by one grade all weak groups  Baseline:  Goal status: 06/04/23 MET  2.  Radicular symptoms to have improved by 75% Baseline:  Goal status:progressing 06/04/23  3.  Pain in neck to have improved by 75% Baseline:   Goal status: progressing 06/04/23  4.  Will be able to play golf  and perform bee keeping duties with minimal difficulty/no increase in pain  Baseline:  Goal status:not met 06/04/23  5.  PSFS to be at least 6 to show improved subjective function  Baseline:  Goal status: INITIAL     PLAN:  PT FREQUENCY: 2x/week  PT DURATION: 8 weeks  PLANNED INTERVENTIONS: 97750- Physical Performance Testing, 97110-Therapeutic exercises, 97530- Therapeutic activity, [id number]- Neuromuscular re-education, 97535- Self Care, [id number]- Manual therapy, [id number]- Electrical stimulation (unattended), 97035- Ultrasound, 29528- Ionotophoresis 4mg /ml Dexamethasone , Taping, and Dry Needling  PLAN FOR NEXT SESSION: MD next week   Ollen Beverage, PTA 06/06/2023, 1:48 PM

## 2023-06-11 DIAGNOSIS — M5440 Lumbago with sciatica, unspecified side: Secondary | ICD-10-CM | POA: Diagnosis not present

## 2023-06-20 DIAGNOSIS — M5440 Lumbago with sciatica, unspecified side: Secondary | ICD-10-CM | POA: Diagnosis not present

## 2023-07-23 DIAGNOSIS — M5440 Lumbago with sciatica, unspecified side: Secondary | ICD-10-CM | POA: Diagnosis not present

## 2023-08-20 DIAGNOSIS — M79601 Pain in right arm: Secondary | ICD-10-CM | POA: Diagnosis not present

## 2023-08-21 DIAGNOSIS — M47816 Spondylosis without myelopathy or radiculopathy, lumbar region: Secondary | ICD-10-CM | POA: Diagnosis not present

## 2023-08-22 DIAGNOSIS — S46811A Strain of other muscles, fascia and tendons at shoulder and upper arm level, right arm, initial encounter: Secondary | ICD-10-CM | POA: Diagnosis not present

## 2023-08-22 DIAGNOSIS — M75121 Complete rotator cuff tear or rupture of right shoulder, not specified as traumatic: Secondary | ICD-10-CM | POA: Diagnosis not present

## 2023-08-22 DIAGNOSIS — S46211A Strain of muscle, fascia and tendon of other parts of biceps, right arm, initial encounter: Secondary | ICD-10-CM | POA: Diagnosis not present

## 2023-08-22 DIAGNOSIS — M7581 Other shoulder lesions, right shoulder: Secondary | ICD-10-CM | POA: Diagnosis not present

## 2023-08-22 DIAGNOSIS — M25811 Other specified joint disorders, right shoulder: Secondary | ICD-10-CM | POA: Diagnosis not present

## 2023-09-10 DIAGNOSIS — M25811 Other specified joint disorders, right shoulder: Secondary | ICD-10-CM | POA: Diagnosis not present

## 2023-09-10 DIAGNOSIS — M75101 Unspecified rotator cuff tear or rupture of right shoulder, not specified as traumatic: Secondary | ICD-10-CM | POA: Diagnosis not present

## 2023-09-20 DIAGNOSIS — S43431A Superior glenoid labrum lesion of right shoulder, initial encounter: Secondary | ICD-10-CM | POA: Diagnosis not present

## 2023-09-20 DIAGNOSIS — M75121 Complete rotator cuff tear or rupture of right shoulder, not specified as traumatic: Secondary | ICD-10-CM | POA: Diagnosis not present

## 2023-09-20 DIAGNOSIS — M25811 Other specified joint disorders, right shoulder: Secondary | ICD-10-CM | POA: Diagnosis not present

## 2023-09-20 DIAGNOSIS — M75111 Incomplete rotator cuff tear or rupture of right shoulder, not specified as traumatic: Secondary | ICD-10-CM | POA: Diagnosis not present

## 2023-09-20 DIAGNOSIS — G8918 Other acute postprocedural pain: Secondary | ICD-10-CM | POA: Diagnosis not present

## 2023-09-20 DIAGNOSIS — S46211A Strain of muscle, fascia and tendon of other parts of biceps, right arm, initial encounter: Secondary | ICD-10-CM | POA: Diagnosis not present

## 2023-09-20 DIAGNOSIS — M19011 Primary osteoarthritis, right shoulder: Secondary | ICD-10-CM | POA: Diagnosis not present

## 2023-10-01 DIAGNOSIS — M47816 Spondylosis without myelopathy or radiculopathy, lumbar region: Secondary | ICD-10-CM | POA: Diagnosis not present

## 2023-10-23 DIAGNOSIS — H903 Sensorineural hearing loss, bilateral: Secondary | ICD-10-CM | POA: Diagnosis not present

## 2023-11-07 DIAGNOSIS — R29898 Other symptoms and signs involving the musculoskeletal system: Secondary | ICD-10-CM | POA: Diagnosis not present

## 2023-11-07 DIAGNOSIS — M25511 Pain in right shoulder: Secondary | ICD-10-CM | POA: Diagnosis not present

## 2023-11-07 DIAGNOSIS — M25611 Stiffness of right shoulder, not elsewhere classified: Secondary | ICD-10-CM | POA: Diagnosis not present

## 2023-11-13 DIAGNOSIS — R29898 Other symptoms and signs involving the musculoskeletal system: Secondary | ICD-10-CM | POA: Diagnosis not present

## 2023-11-13 DIAGNOSIS — M25511 Pain in right shoulder: Secondary | ICD-10-CM | POA: Diagnosis not present

## 2023-11-13 DIAGNOSIS — M25611 Stiffness of right shoulder, not elsewhere classified: Secondary | ICD-10-CM | POA: Diagnosis not present

## 2023-11-15 DIAGNOSIS — M25611 Stiffness of right shoulder, not elsewhere classified: Secondary | ICD-10-CM | POA: Diagnosis not present

## 2023-11-15 DIAGNOSIS — M25511 Pain in right shoulder: Secondary | ICD-10-CM | POA: Diagnosis not present

## 2023-11-15 DIAGNOSIS — R29898 Other symptoms and signs involving the musculoskeletal system: Secondary | ICD-10-CM | POA: Diagnosis not present

## 2023-11-20 DIAGNOSIS — M25611 Stiffness of right shoulder, not elsewhere classified: Secondary | ICD-10-CM | POA: Diagnosis not present

## 2023-11-20 DIAGNOSIS — M25511 Pain in right shoulder: Secondary | ICD-10-CM | POA: Diagnosis not present

## 2023-11-20 DIAGNOSIS — R29898 Other symptoms and signs involving the musculoskeletal system: Secondary | ICD-10-CM | POA: Diagnosis not present

## 2023-11-22 DIAGNOSIS — R29898 Other symptoms and signs involving the musculoskeletal system: Secondary | ICD-10-CM | POA: Diagnosis not present

## 2023-11-22 DIAGNOSIS — M25511 Pain in right shoulder: Secondary | ICD-10-CM | POA: Diagnosis not present

## 2023-11-22 DIAGNOSIS — M25611 Stiffness of right shoulder, not elsewhere classified: Secondary | ICD-10-CM | POA: Diagnosis not present

## 2023-11-27 DIAGNOSIS — R29898 Other symptoms and signs involving the musculoskeletal system: Secondary | ICD-10-CM | POA: Diagnosis not present

## 2023-11-27 DIAGNOSIS — M25511 Pain in right shoulder: Secondary | ICD-10-CM | POA: Diagnosis not present

## 2023-11-27 DIAGNOSIS — M25611 Stiffness of right shoulder, not elsewhere classified: Secondary | ICD-10-CM | POA: Diagnosis not present

## 2023-11-29 DIAGNOSIS — M25611 Stiffness of right shoulder, not elsewhere classified: Secondary | ICD-10-CM | POA: Diagnosis not present

## 2023-11-29 DIAGNOSIS — R29898 Other symptoms and signs involving the musculoskeletal system: Secondary | ICD-10-CM | POA: Diagnosis not present

## 2023-11-29 DIAGNOSIS — M25511 Pain in right shoulder: Secondary | ICD-10-CM | POA: Diagnosis not present

## 2023-12-02 DIAGNOSIS — R29898 Other symptoms and signs involving the musculoskeletal system: Secondary | ICD-10-CM | POA: Diagnosis not present

## 2023-12-02 DIAGNOSIS — M25511 Pain in right shoulder: Secondary | ICD-10-CM | POA: Diagnosis not present

## 2023-12-02 DIAGNOSIS — M25611 Stiffness of right shoulder, not elsewhere classified: Secondary | ICD-10-CM | POA: Diagnosis not present

## 2023-12-11 DIAGNOSIS — R29898 Other symptoms and signs involving the musculoskeletal system: Secondary | ICD-10-CM | POA: Diagnosis not present

## 2023-12-11 DIAGNOSIS — M25611 Stiffness of right shoulder, not elsewhere classified: Secondary | ICD-10-CM | POA: Diagnosis not present

## 2023-12-11 DIAGNOSIS — M25511 Pain in right shoulder: Secondary | ICD-10-CM | POA: Diagnosis not present

## 2023-12-13 DIAGNOSIS — M25611 Stiffness of right shoulder, not elsewhere classified: Secondary | ICD-10-CM | POA: Diagnosis not present

## 2023-12-13 DIAGNOSIS — M25511 Pain in right shoulder: Secondary | ICD-10-CM | POA: Diagnosis not present

## 2023-12-13 DIAGNOSIS — R29898 Other symptoms and signs involving the musculoskeletal system: Secondary | ICD-10-CM | POA: Diagnosis not present

## 2023-12-17 DIAGNOSIS — M25511 Pain in right shoulder: Secondary | ICD-10-CM | POA: Diagnosis not present

## 2023-12-17 DIAGNOSIS — M25611 Stiffness of right shoulder, not elsewhere classified: Secondary | ICD-10-CM | POA: Diagnosis not present

## 2023-12-17 DIAGNOSIS — R29898 Other symptoms and signs involving the musculoskeletal system: Secondary | ICD-10-CM | POA: Diagnosis not present

## 2023-12-20 DIAGNOSIS — R29898 Other symptoms and signs involving the musculoskeletal system: Secondary | ICD-10-CM | POA: Diagnosis not present

## 2023-12-20 DIAGNOSIS — M25611 Stiffness of right shoulder, not elsewhere classified: Secondary | ICD-10-CM | POA: Diagnosis not present

## 2023-12-20 DIAGNOSIS — M25511 Pain in right shoulder: Secondary | ICD-10-CM | POA: Diagnosis not present
